# Patient Record
Sex: Female | Born: 1980 | Hispanic: No | Marital: Married | Smoking: Never smoker
Health system: Southern US, Community
[De-identification: ages and names within clinical notes are randomized; demographics above are authoritative.]

## PROBLEM LIST (undated history)

## (undated) ENCOUNTER — Inpatient Hospital Stay (HOSPITAL_COMMUNITY): Payer: Self-pay

## (undated) DIAGNOSIS — E785 Hyperlipidemia, unspecified: Secondary | ICD-10-CM

## (undated) DIAGNOSIS — J309 Allergic rhinitis, unspecified: Secondary | ICD-10-CM

## (undated) DIAGNOSIS — Z789 Other specified health status: Secondary | ICD-10-CM

## (undated) DIAGNOSIS — E559 Vitamin D deficiency, unspecified: Secondary | ICD-10-CM

## (undated) DIAGNOSIS — R519 Headache, unspecified: Secondary | ICD-10-CM

## (undated) DIAGNOSIS — R51 Headache: Secondary | ICD-10-CM

## (undated) HISTORY — PX: NO PAST SURGERIES: SHX2092

## (undated) HISTORY — DX: Headache: R51

## (undated) HISTORY — DX: Vitamin D deficiency, unspecified: E55.9

## (undated) HISTORY — DX: Allergic rhinitis, unspecified: J30.9

## (undated) HISTORY — DX: Headache, unspecified: R51.9

## (undated) HISTORY — DX: Hyperlipidemia, unspecified: E78.5

---

## 2005-10-05 ENCOUNTER — Emergency Department (HOSPITAL_COMMUNITY): Admission: EM | Admit: 2005-10-05 | Discharge: 2005-10-05 | Payer: Self-pay | Admitting: Emergency Medicine

## 2006-04-05 ENCOUNTER — Inpatient Hospital Stay (HOSPITAL_COMMUNITY): Admission: AD | Admit: 2006-04-05 | Discharge: 2006-04-05 | Payer: Self-pay | Admitting: Obstetrics

## 2006-04-14 ENCOUNTER — Inpatient Hospital Stay (HOSPITAL_COMMUNITY): Admission: AD | Admit: 2006-04-14 | Discharge: 2006-04-18 | Payer: Self-pay | Admitting: Obstetrics

## 2007-07-03 ENCOUNTER — Inpatient Hospital Stay (HOSPITAL_COMMUNITY): Admission: AD | Admit: 2007-07-03 | Discharge: 2007-07-06 | Payer: Self-pay | Admitting: Obstetrics & Gynecology

## 2010-12-31 ENCOUNTER — Emergency Department (HOSPITAL_COMMUNITY): Payer: Self-pay

## 2010-12-31 ENCOUNTER — Emergency Department (HOSPITAL_COMMUNITY)
Admission: EM | Admit: 2010-12-31 | Discharge: 2010-12-31 | Discharge status: Against Medical Advice | Payer: Self-pay | Attending: Emergency Medicine | Admitting: Emergency Medicine

## 2010-12-31 DIAGNOSIS — R112 Nausea with vomiting, unspecified: Secondary | ICD-10-CM | POA: Insufficient documentation

## 2010-12-31 DIAGNOSIS — R109 Unspecified abdominal pain: Secondary | ICD-10-CM | POA: Insufficient documentation

## 2010-12-31 LAB — COMPREHENSIVE METABOLIC PANEL
ALT: 31 U/L (ref 0–35)
AST: 24 U/L (ref 0–37)
Albumin: 4.5 g/dL (ref 3.5–5.2)
CO2: 25 mEq/L (ref 19–32)
Calcium: 9.9 mg/dL (ref 8.4–10.5)
Creatinine, Ser: 0.79 mg/dL (ref 0.4–1.2)
GFR calc Af Amer: >60 mL/min (ref >60)
Sodium: 137 mEq/L (ref 135–145)
Total Protein: 8.5 g/dL — ABNORMAL HIGH (ref 6.0–8.3)

## 2010-12-31 LAB — URINALYSIS, ROUTINE W REFLEX MICROSCOPIC
Bilirubin Urine: NEGATIVE
Glucose, UA: NEGATIVE mg/dL
Specific Gravity, Urine: 1.028 (ref 1.005–1.030)
Urobilinogen, UA: 0.2 mg/dL (ref 0.0–1.0)
pH: 5.5 (ref 5.0–8.0)

## 2010-12-31 LAB — DIFFERENTIAL
Basophils Absolute: 0.1 10*3/uL (ref 0.0–0.1)
Basophils Relative: 0 % (ref 0–1)
Eosinophils Relative: 2 % (ref 0–5)
Lymphocytes Relative: 33 % (ref 12–46)

## 2010-12-31 LAB — CBC
HCT: 42.4 % (ref 36.0–46.0)
MCHC: 34 g/dL (ref 30.0–36.0)
Platelets: 279 10*3/uL (ref 150–400)
RDW: 12.4 % (ref 11.5–15.5)
WBC: 13.8 10*3/uL — ABNORMAL HIGH (ref 4.0–10.5)

## 2010-12-31 LAB — PREGNANCY, URINE: Preg Test, Ur: NEGATIVE

## 2010-12-31 LAB — URINE MICROSCOPIC-ADD ON

## 2011-01-13 NOTE — H&P (Signed)
NAMESHEREA, LIPTAK             ACCOUNT NO.:  [id number]   MEDICAL RECORD NO.:  [id number]          PATIENT TYPE:  INP   LOCATION:  9164                          FACILITY:  [facility]   PHYSICIAN:  Roseanna Rainbow, M.D.DATE OF BIRTH:  08/25/1981   DATE OF ADMISSION:  07/03/2007  DATE OF DISCHARGE:                              HISTORY & PHYSICAL   CHIEF COMPLAINT:  The patient is a 30 year old gravida 2, para 1 with an  estimated date of confinement of July 11, 2007, with an intrauterine  pregnancy at 39+ weeks for elective induction of labor.   HISTORY OF PRESENT ILLNESS:  Please see the above.   ALLERGIES:  No known drug allergies.   MEDICATION:  Prenatal vitamins.   OBSTETRICAL RISK FACTORS:  Short inter pregnancy interval and positive  PPD, negative chest x-ray.   PRENATAL LABS:  GC probe negative, chlamydia probe negative.  Sickle  cell negative, RPR nonreactive.  Blood type is A+, antibody screen  negative, platelets 323,000.  HIV nonreactive.  Hemoglobin 12.4,  hematocrit 38.5.  Hepatitis B surface antigen negative.  Pap smear  negative.  GBS negative on June 23, 2007.   PAST GYNECOLOGICAL HISTORY:  Noncontributory.   PAST MEDICAL HISTORY:  No significant history of medical diseases.   PAST SURGICAL HISTORY:  No previous surgery.   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  Does not give any significant history of alcohol usage.  Has no significant smoking history.  Denies illicit drug use.   FAMILY HISTORY:  No major illnesses known.   PAST OBSTETRICAL HISTORY:  In August 2007, she was delivered of a live-  born female 7 pounds 11 ounces, vaginal delivery.  No complications.   PHYSICAL EXAMINATION:  VITAL SIGNS:  Stable, afebrile.  Fetal heart  tracing reassuring.  Tocodynamometer regular uterine contractions.  STERILE VAGINAL EXAM:  Per the RN, loose was 3, 70%.   ASSESSMENT:  Primipara at term and favorable  Bishop score, fetal heart  tracing consistent with fetal well-being.   PLAN:  Admission, induction of labor.  Low-dose Pitocin per protocol.      Roseanna Rainbow, M.D.  Electronically Signed     LAJ/MEDQ  D:  07/04/2007  T:  07/04/2007  Job:  [id number]

## 2011-01-16 NOTE — H&P (Signed)
Kirsten Kramer, Kirsten Kramer             ACCOUNT NO.:  [id number]   MEDICAL RECORD NO.:  [id number]          PATIENT TYPE:  INP   LOCATION:  9164                          FACILITY:  [facility]   PHYSICIAN:  Roseanna Rainbow, M.D.DATE OF BIRTH:  Jul 18, 1981   DATE OF ADMISSION:  04/14/2006  DATE OF DISCHARGE:                                HISTORY & PHYSICAL   CHIEF COMPLAINT:  The patient is a 30 year old para 0 with an estimated date  of confinement of August 10 with an intrauterine pregnancy at 40-5/7 weeks  for an induction of labor.   HISTORY OF PRESENT ILLNESS:  The patient was seen earlier in the office  today and her blood pressures were somewhat labile, 140s/80s.  She denied  any neurological symptoms.   ALLERGIES:  NO KNOWN DRUG ALLERGIES.   MEDICATIONS:  None.   RISK FACTORS:  Positive PPD.  GBS positive.   LABORATORY:  Blood type A positive.  Antibody screen negative.  RPR  nonreactive.  Rubella immune.  Hepatitis B surface antigen negative.  GBS  positive.  Hemoglobin 10.4, hematocrit 32.5.  Chlamydia negative.  GC  negative.  Urine culture and sensitivity, no growth.  Platelets 305,000.  One-hour GCT 135.  HIV declined.  Pap smear within normal limits.   PAST GYN HISTORY:  Noncontributory.   PAST MEDICAL HISTORY:  No significant history of medical diseases.   PAST SURGICAL HISTORY:  No previous surgery.   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  She  is single, does not give any significant history of alcohol usage, has no  significant smoking history.  Denies illicit drug use.   FAMILY HISTORY:  No major illnesses known.   PHYSICAL EXAMINATION:  VITAL SIGNS:  Stable.  Afebrile.  Fetal heart tracing  reassuring.  TOCODYNAMOMETER:  Rare uterine contractions.  SVE:  The cervix is 1, 50%.   ASSESSMENT:  Intrauterine pregnancy at 40-5/7 weeks, unfavorable Bishop  score.  Fetal heart tracing consistent with fetal well being.  Group B  strep  positive.   PLAN:  Admission.  Induction of labor.  Will begin with cervical ripening to  be followed by Pitocin and AROM.  Penicillin GBS prophylaxis in early labor.      Roseanna Rainbow, M.D.  Electronically Signed     LAJ/MEDQ  D:  04/15/2006  T:  04/15/2006  Job:  [id number]

## 2011-06-09 LAB — CBC
HCT: 32.3 — ABNORMAL LOW
HCT: 34.7 — ABNORMAL LOW
Hemoglobin: 10.9 — ABNORMAL LOW
Hemoglobin: 11.6 — ABNORMAL LOW
MCHC: 33.6
MCV: 78.2
Platelets: 187
RBC: 4.14
RDW: 14.9 — ABNORMAL HIGH
RDW: 15.5 — ABNORMAL HIGH
WBC: 11.1 — ABNORMAL HIGH
WBC: 13.5 — ABNORMAL HIGH

## 2011-06-09 LAB — SYPHILIS: RPR W/REFLEX TO RPR TITER AND TREPONEMAL ANTIBODIES, TRADITIONAL SCREENING AND DIAGNOSIS ALGORITHM: RPR Ser Ql: NONREACTIVE

## 2011-10-06 ENCOUNTER — Encounter (HOSPITAL_COMMUNITY): Payer: Self-pay

## 2011-10-06 ENCOUNTER — Inpatient Hospital Stay (HOSPITAL_COMMUNITY)
Admission: AD | Admit: 2011-10-06 | Discharge: 2011-10-06 | Disposition: A | Discharge status: Home / Self Care | Payer: Medicaid Other | Source: Ambulatory Visit | Attending: Obstetrics & Gynecology | Admitting: Obstetrics & Gynecology

## 2011-10-06 DIAGNOSIS — R112 Nausea with vomiting, unspecified: Secondary | ICD-10-CM

## 2011-10-06 DIAGNOSIS — O210 Mild hyperemesis gravidarum: Secondary | ICD-10-CM | POA: Insufficient documentation

## 2011-10-06 HISTORY — DX: Other specified health status: Z78.9

## 2011-10-06 LAB — URINALYSIS, ROUTINE W REFLEX MICROSCOPIC
Glucose, UA: NEGATIVE mg/dL
Ketones, ur: 15 mg/dL — AB
pH: 5 (ref 5.0–8.0)

## 2011-10-06 LAB — COMPREHENSIVE METABOLIC PANEL
ALT: 27 U/L (ref 0–35)
Alkaline Phosphatase: 44 U/L (ref 39–117)
BUN: 7 mg/dL (ref 6–23)
CO2: 24 mEq/L (ref 19–32)
Chloride: 102 mEq/L (ref 96–112)
GFR calc Af Amer: >90 mL/min (ref >90)
GFR calc non Af Amer: >90 mL/min (ref >90)
Glucose, Bld: 93 mg/dL (ref 70–99)
Potassium: 3.9 mEq/L (ref 3.5–5.1)
Sodium: 134 mEq/L — ABNORMAL LOW (ref 135–145)
Total Bilirubin: 0.8 mg/dL (ref 0.3–1.2)

## 2011-10-06 LAB — CBC
HCT: 37.9 % (ref 36.0–46.0)
Hemoglobin: 12.8 g/dL (ref 12.0–15.0)
RBC: 4.8 MIL/uL (ref 3.87–5.11)

## 2011-10-06 LAB — URINE MICROSCOPIC-ADD ON

## 2011-10-06 LAB — POCT PREGNANCY, URINE: Preg Test, Ur: POSITIVE — AB

## 2011-10-06 MED ORDER — PROMETHAZINE HCL 25 MG/ML IJ SOLN
25.0000 mg | Freq: Once | INTRAMUSCULAR | Status: AC
  Administered 2011-10-06: 25 mg via INTRAVENOUS
  Filled 2011-10-06: qty 1

## 2011-10-06 MED ORDER — LACTATED RINGERS IV BOLUS (SEPSIS)
1000.0000 mL | Freq: Once | INTRAVENOUS | Status: AC
  Administered 2011-10-06: 1000 mL via INTRAVENOUS

## 2011-10-06 MED ORDER — PROMETHAZINE HCL 25 MG PO TABS: 25.0000 mg | ORAL_TABLET | Freq: Four times a day (QID) | ORAL | Status: AC | PRN

## 2011-10-06 NOTE — Progress Notes (Signed)
Patient states she has had a pregnancy test at [facility] that states she is 2 months. Has had vomiting and lower abdominal pain for entire time. Denies any bleeding.

## 2011-10-06 NOTE — ED Notes (Signed)
Viable IUP noted on bedside US by Ivonne Andrew CNM

## 2011-10-06 NOTE — ED Provider Notes (Signed)
Attestation of Attending Supervision of Advanced Practitioner: Evaluation and management procedures were performed by the PA/NP/CNM/OB Fellow under my supervision/collaboration. Chart reviewed, and agree with management and plan.  Taheem Fricke, M.D. 10/06/2011 1:58 PM   

## 2011-10-06 NOTE — Progress Notes (Signed)
Vomiting for past 2 months - getting worse.

## 2011-10-06 NOTE — ED Provider Notes (Signed)
History   Pt presents today c/o worsening N&V over the past 2 months. She has also had some mild lower abd cramping. She denies vag dc, bleeding, fever, dysuria, or any other problems at this time.  Chief Complaint  Patient presents with  . Emesis   HPI  OB History    Grav Para Term Preterm Abortions TAB SAB Ect Mult Living   3 2 2  0 0 0 0 0 0 2      Past Medical History  Diagnosis Date  . No pertinent past medical history     Past Surgical History  Procedure Date  . No past surgeries     Family History  Problem Relation Age of Onset  . Anesthesia problems Neg Hx   . Cancer Father     prostate  . Diabetes Father     History  Substance Use Topics  . Smoking status: Never Smoker   . Smokeless tobacco: Never Used  . Alcohol Use: No    Allergies: No Known Allergies  Prescriptions prior to admission  Medication Sig Dispense Refill  . ranitidine (ZANTAC) 75 MG tablet Take 75 mg by mouth 2 (two) times daily. For heartburn.        Review of Systems  Constitutional: Negative for fever.  Eyes: Negative for blurred vision and double vision.  Respiratory: Negative for cough, hemoptysis, sputum production, shortness of breath and wheezing.   Cardiovascular: Negative for chest pain, palpitations, orthopnea and claudication.  Gastrointestinal: Positive for nausea, vomiting and abdominal pain. Negative for diarrhea and constipation.  Genitourinary: Negative for dysuria, urgency, frequency and hematuria.  Neurological: Negative for dizziness and headaches.  Psychiatric/Behavioral: Negative for depression and suicidal ideas.   Physical Exam   Blood pressure 127/73, pulse 82, temperature 98.2 F (36.8 C), temperature source Oral, resp. rate 16, height 5\' 3"  (1.6 m), weight 168 lb 9.6 oz (76.476 kg), last menstrual period 07/07/2011, SpO2 99.00%.  Physical Exam  Nursing note and vitals reviewed. Constitutional: She is oriented to person, place, and time. She appears  well-developed and well-nourished. No distress.  HENT:  Head: Normocephalic and atraumatic.  Eyes: EOM are normal. Pupils are equal, round, and reactive to light.  Neurological: She is alert and oriented to person, place, and time.  Skin: Skin is warm and dry. She is not diaphoretic.  Psychiatric: She has a normal mood and affect. Her behavior is normal. Judgment and thought content normal.    MAU Course  Procedures  Results for orders placed during the hospital encounter of 10/06/11 (from the past 72 hour(s))  URINALYSIS, ROUTINE W REFLEX MICROSCOPIC     Status: Abnormal   Collection Time   10/06/11 10:25 AM      Component Value Range Comment   Color, Urine YELLOW  YELLOW     APPearance CLEAR  CLEAR     Specific Gravity, Urine 1.025  1.005 - 1.030     pH 5.0  5.0 - 8.0     Glucose, UA NEGATIVE  NEGATIVE (mg/dL)    Hgb urine dipstick SMALL (*) NEGATIVE     Bilirubin Urine NEGATIVE  NEGATIVE     Ketones, ur 15 (*) NEGATIVE (mg/dL)    Protein, ur NEGATIVE  NEGATIVE (mg/dL)    Urobilinogen, UA 0.2  0.0 - 1.0 (mg/dL)    Nitrite NEGATIVE  NEGATIVE     Leukocytes, UA TRACE (*) NEGATIVE    URINE MICROSCOPIC-ADD ON     Status: Abnormal   Collection Time  10/06/11 10:25 AM      Component Value Range Comment   Squamous Epithelial / LPF MANY (*) RARE     WBC, UA 3-6  <3 (WBC/hpf)    RBC / HPF 3-6  <3 (RBC/hpf)    Urine-Other MUCOUS PRESENT     POCT PREGNANCY, URINE     Status: Abnormal   Collection Time   10/06/11 10:39 AM      Component Value Range Comment   Preg Test, Ur POSITIVE (*) NEGATIVE    CBC     Status: Abnormal   Collection Time   10/06/11 11:01 AM      Component Value Range Comment   WBC 11.7 (*) 4.0 - 10.5 (K/uL)    RBC 4.80  3.87 - 5.11 (MIL/uL)    Hemoglobin 12.8  12.0 - 15.0 (g/dL)    HCT 40.9  81.1 - 91.4 (%)    MCV 79.0  78.0 - 100.0 (fL)    MCH 26.7  26.0 - 34.0 (pg)    MCHC 33.8  30.0 - 36.0 (g/dL)    RDW 78.2  95.6 - 21.3 (%)    Platelets 235  150 - 400 (K/uL)    COMPREHENSIVE METABOLIC PANEL     Status: Abnormal   Collection Time   10/06/11 11:01 AM      Component Value Range Comment   Sodium 134 (*) 135 - 145 (mEq/L)    Potassium 3.9  3.5 - 5.1 (mEq/L)    Chloride 102  96 - 112 (mEq/L)    CO2 24  19 - 32 (mEq/L)    Glucose, Bld 93  70 - 99 (mg/dL)    BUN 7  6 - 23 (mg/dL)    Creatinine, Ser 0.86  0.50 - 1.10 (mg/dL)    Calcium 9.6  8.4 - 10.5 (mg/dL)    Total Protein 7.0  6.0 - 8.3 (g/dL)    Albumin 3.6  3.5 - 5.2 (g/dL)    AST 17  0 - 37 (U/L)    ALT 27  0 - 35 (U/L)    Alkaline Phosphatase 44  39 - 117 (U/L)    Total Bilirubin 0.8  0.3 - 1.2 (mg/dL)    GFR calc non Af Amer >90  >90 (mL/min)    GFR calc Af Amer >90  >90 (mL/min)     Dorathy Kinsman CNM did bedside US which did demonstrate a single IUP with cardiac activity in the 160s. Assessment and Plan  N&V: discussed with pt at length. Will give Rx for phenergan to use prn. She will f/u with her OB provider. Discussed diet, activity, risks, and precautions.  Clinton Gallant. Rice III, DrHSc, MPAS, PA-C  10/06/2011, 12:32 PM   Henrietta Hoover, PA 10/06/11 1244

## 2011-10-27 ENCOUNTER — Other Ambulatory Visit: Payer: Self-pay

## 2011-10-27 LAB — OB RESULTS CONSOLE RPR: RPR: NONREACTIVE

## 2011-10-27 LAB — OB RESULTS CONSOLE RUBELLA ANTIBODY, IGM: Rubella: IMMUNE

## 2011-10-27 LAB — OB RESULTS CONSOLE HEPATITIS B SURFACE ANTIGEN: Hepatitis B Surface Ag: NEGATIVE

## 2011-10-27 LAB — OB RESULTS CONSOLE HIV ANTIBODY (ROUTINE TESTING): HIV: NONREACTIVE

## 2011-10-28 ENCOUNTER — Encounter (HOSPITAL_COMMUNITY): Payer: Self-pay

## 2011-10-28 ENCOUNTER — Inpatient Hospital Stay (HOSPITAL_COMMUNITY)
Admission: AD | Admit: 2011-10-28 | Discharge: 2011-10-28 | Disposition: A | Discharge status: Home / Self Care | Payer: Medicaid Other | Source: Ambulatory Visit | Attending: Obstetrics & Gynecology | Admitting: Obstetrics & Gynecology

## 2011-10-28 DIAGNOSIS — O26899 Other specified pregnancy related conditions, unspecified trimester: Secondary | ICD-10-CM

## 2011-10-28 DIAGNOSIS — O210 Mild hyperemesis gravidarum: Secondary | ICD-10-CM | POA: Insufficient documentation

## 2011-10-28 DIAGNOSIS — O219 Vomiting of pregnancy, unspecified: Secondary | ICD-10-CM

## 2011-10-28 LAB — CBC
MCV: 79.9 fL (ref 78.0–100.0)
Platelets: 232 10*3/uL (ref 150–400)
RDW: 13.2 % (ref 11.5–15.5)
WBC: 14.3 10*3/uL — ABNORMAL HIGH (ref 4.0–10.5)

## 2011-10-28 LAB — DIFFERENTIAL
Basophils Absolute: 0 10*3/uL (ref 0.0–0.1)
Eosinophils Absolute: 0.3 10*3/uL (ref 0.0–0.7)
Eosinophils Relative: 2 % (ref 0–5)
Lymphocytes Relative: 22 % (ref 12–46)
Neutrophils Relative %: 70 % (ref 43–77)

## 2011-10-28 LAB — COMPREHENSIVE METABOLIC PANEL
ALT: 15 U/L (ref 0–35)
AST: 17 U/L (ref 0–37)
CO2: 25 mEq/L (ref 19–32)
Calcium: 9.7 mg/dL (ref 8.4–10.5)
Sodium: 136 mEq/L (ref 135–145)
Total Protein: 7 g/dL (ref 6.0–8.3)

## 2011-10-28 LAB — URINE MICROSCOPIC-ADD ON

## 2011-10-28 LAB — URINALYSIS, ROUTINE W REFLEX MICROSCOPIC
Glucose, UA: NEGATIVE mg/dL
Nitrite: NEGATIVE
Specific Gravity, Urine: 1.025 (ref 1.005–1.030)
pH: 6 (ref 5.0–8.0)

## 2011-10-28 MED ORDER — FAMOTIDINE 20 MG PO TABS: 20.0000 mg | ORAL_TABLET | Freq: Two times a day (BID) | ORAL | Status: AC

## 2011-10-28 MED ORDER — FAMOTIDINE IN NACL 20-0.9 MG/50ML-% IV SOLN
20.0000 mg | Freq: Once | INTRAVENOUS | Status: AC
  Administered 2011-10-28: 20 mg via INTRAVENOUS
  Filled 2011-10-28: qty 50

## 2011-10-28 MED ORDER — ONDANSETRON 8 MG PO TBDP: 4.0000 mg | ORAL_TABLET | Freq: Three times a day (TID) | ORAL | Status: AC | PRN

## 2011-10-28 MED ORDER — ONDANSETRON HCL 4 MG/2ML IJ SOLN
4.0000 mg | Freq: Once | INTRAMUSCULAR | Status: AC
  Administered 2011-10-28: 4 mg via INTRAVENOUS
  Filled 2011-10-28: qty 2

## 2011-10-28 MED ORDER — LACTATED RINGERS IV SOLN
INTRAVENOUS | Status: DC
  Administered 2011-10-28: 20:00:00 via INTRAVENOUS

## 2011-10-28 NOTE — ED Provider Notes (Signed)
History     CSN: [id number]  Arrival date & time 10/28/11  1827   None     Chief Complaint  Patient presents with  . Hyperemesis Gravidarum   HPI Kirsten Kramer is a 31 y.o. female @ [redacted]w[redacted]d gestation who presents to MAU for nausea and vomiting that started 2 weeks ago. Had diarrhea at first but that has stopped. States every time she eats she vomits. Complains of bad indigestion. The history was provided by the patient.  Past Medical History  Diagnosis Date  . No pertinent past medical history     Past Surgical History  Procedure Date  . No past surgeries     Family History  Problem Relation Age of Onset  . Anesthesia problems Neg Hx   . Cancer Father     prostate  . Diabetes Father     History  Substance Use Topics  . Smoking status: Never Smoker   . Smokeless tobacco: Never Used  . Alcohol Use: No    OB History    Grav Para Term Preterm Abortions TAB SAB Ect Mult Living   3 2 2  0 0 0 0 0 0 2      Review of Systems  Constitutional: Positive for chills. Negative for fever, diaphoresis and fatigue.  HENT: Positive for congestion. Negative for ear pain, sore throat, facial swelling, neck pain, neck stiffness, dental problem and sinus pressure.   Eyes: Negative for photophobia, pain and discharge.  Respiratory: Positive for cough. Negative for chest tightness and wheezing.   Gastrointestinal: Positive for nausea, vomiting and abdominal pain. Negative for diarrhea, constipation and abdominal distention.  Genitourinary: Negative for dysuria, urgency, frequency, flank pain and difficulty urinating.  Musculoskeletal: Positive for back pain. Negative for myalgias and gait problem.  Skin: Negative for color change and rash.  Neurological: Negative for dizziness, speech difficulty, weakness, light-headedness, numbness and headaches.  Psychiatric/Behavioral: Negative for confusion and agitation.    Allergies  Review of patient's allergies indicates no known  allergies.  Home Medications  No current outpatient prescriptions on file.  BP 117/80  Pulse 91  Temp(Src) 100.3 F (37.9 C) (Oral)  Resp 18  Ht 5' 2.5" (1.588 m)  Wt 165 lb 12.8 oz (75.206 kg)  BMI 29.84 kg/m2  SpO2 100%  LMP 07/07/2011  Physical Exam  Nursing note and vitals reviewed. Constitutional: She is oriented to person, place, and time. She appears well-developed and well-nourished. No distress.  HENT:  Head: Normocephalic.       Temporal headache.  Eyes: EOM are normal.  Neck: Neck supple.  Cardiovascular: Normal rate.   Pulmonary/Chest: Effort normal.       Occasional wheezing RUL  Abdominal: Soft. There is no tenderness. There is no CVA tenderness.       +FHT  Musculoskeletal: Normal range of motion.  Neurological: She is alert and oriented to person, place, and time. No cranial nerve deficit.  Skin: Skin is warm and dry.  Psychiatric: She has a normal mood and affect. Her behavior is normal. Judgment and thought content normal.   Results for orders placed during the hospital encounter of 10/28/11 (from the past 24 hour(s))  URINALYSIS, ROUTINE W REFLEX MICROSCOPIC     Status: Abnormal   Collection Time   10/28/11  6:40 PM      Component Value Range   Color, Urine AMBER (*) YELLOW    APPearance CLOUDY (*) CLEAR    Specific Gravity, Urine 1.025  1.005 - 1.030  pH 6.0  5.0 - 8.0    Glucose, UA NEGATIVE  NEGATIVE (mg/dL)   Hgb urine dipstick TRACE (*) NEGATIVE    Bilirubin Urine SMALL (*) NEGATIVE    Ketones, ur 15 (*) NEGATIVE (mg/dL)   Protein, ur NEGATIVE  NEGATIVE (mg/dL)   Urobilinogen, UA 1.0  0.0 - 1.0 (mg/dL)   Nitrite NEGATIVE  NEGATIVE    Leukocytes, UA NEGATIVE  NEGATIVE   URINE MICROSCOPIC-ADD ON     Status: Abnormal   Collection Time   10/28/11  6:40 PM      Component Value Range   Squamous Epithelial / LPF MANY (*) RARE    WBC, UA 0-2  <3 (WBC/hpf)   RBC / HPF 0-2  <3 (RBC/hpf)   Bacteria, UA FEW (*) RARE    Urine-Other MUCOUS PRESENT     CBC     Status: Abnormal   Collection Time   10/28/11  7:30 PM      Component Value Range   WBC 14.3 (*) 4.0 - 10.5 (K/uL)   RBC 4.73  3.87 - 5.11 (MIL/uL)   Hemoglobin 12.8  12.0 - 15.0 (g/dL)   HCT 16.1  09.6 - 04.5 (%)   MCV 79.9  78.0 - 100.0 (fL)   MCH 27.1  26.0 - 34.0 (pg)   MCHC 33.9  30.0 - 36.0 (g/dL)   RDW 40.9  81.1 - 91.4 (%)   Platelets 232  150 - 400 (K/uL)  DIFFERENTIAL     Status: Abnormal   Collection Time   10/28/11  7:30 PM      Component Value Range   Neutrophils Relative 70  43 - 77 (%)   Neutro Abs 10.0 (*) 1.7 - 7.7 (K/uL)   Lymphocytes Relative 22  12 - 46 (%)   Lymphs Abs 3.1  0.7 - 4.0 (K/uL)   Monocytes Relative 6  3 - 12 (%)   Monocytes Absolute 0.8  0.1 - 1.0 (K/uL)   Eosinophils Relative 2  0 - 5 (%)   Eosinophils Absolute 0.3  0.0 - 0.7 (K/uL)   Basophils Relative 0  0 - 1 (%)   Basophils Absolute 0.0  0.0 - 0.1 (K/uL)   Assessment:  Nausea and vomiting in pregnancy  Plan:  Care turned over to Georges Mouse, CNM ED Course  Procedures   MDM     Kerrie Buffalo, NP 10/28/11 2003  Pt feeling better with IV hydration, Pepcid and Zofran IV  A/P: 31 y.o. G3P2002 at [redacted]w[redacted]d N/V and heartburn of pregnancy - rx Pepcid and Zofran F/U as scheduled

## 2011-10-28 NOTE — Progress Notes (Signed)
Pt states n/v began 3 weeks ago, noted diarrhea 2 weeks ago, however none present. States will vomit every time she tries to eat something. Not taking anything presently for hyperemesis. Went to MD office, MD was unavailable to see her, so no rx's obtained.

## 2011-12-18 ENCOUNTER — Other Ambulatory Visit: Payer: Self-pay | Admitting: Obstetrics & Gynecology

## 2011-12-18 DIAGNOSIS — O280 Abnormal hematological finding on antenatal screening of mother: Secondary | ICD-10-CM

## 2011-12-23 ENCOUNTER — Ambulatory Visit (HOSPITAL_COMMUNITY): Payer: Medicaid Other

## 2012-01-01 ENCOUNTER — Ambulatory Visit (HOSPITAL_COMMUNITY)
Admission: RE | Admit: 2012-01-01 | Discharge: 2012-01-01 | Disposition: A | Discharge status: Home / Self Care | Payer: Medicaid Other | Source: Ambulatory Visit | Attending: Obstetrics & Gynecology | Admitting: Obstetrics & Gynecology

## 2012-01-01 DIAGNOSIS — O358XX0 Maternal care for other (suspected) fetal abnormality and damage, not applicable or unspecified: Secondary | ICD-10-CM | POA: Insufficient documentation

## 2012-01-01 DIAGNOSIS — O280 Abnormal hematological finding on antenatal screening of mother: Secondary | ICD-10-CM

## 2012-01-01 DIAGNOSIS — O289 Unspecified abnormal findings on antenatal screening of mother: Secondary | ICD-10-CM | POA: Insufficient documentation

## 2012-01-01 DIAGNOSIS — Z363 Encounter for antenatal screening for malformations: Secondary | ICD-10-CM | POA: Insufficient documentation

## 2012-01-01 DIAGNOSIS — Z1389 Encounter for screening for other disorder: Secondary | ICD-10-CM | POA: Insufficient documentation

## 2012-01-01 NOTE — Progress Notes (Signed)
Genetic Counseling  High-Risk Gestation Note  Appointment Date:  01/01/2012 Referred By: Antionette Char, MD Date of Birth:  05/05/1981    Pregnancy History: G3P2002 Estimated Date of Delivery: 04/29/12 Estimated Gestational Age: [redacted]w[redacted]d Attending: Alpha Gula, MD   [name] was seen for genetic counseling because of an increased risk for fetal Down syndrome based on Quad screening performed through [facility]. The patient was accompanied by her friend.   They were counseled regarding the Quad screen result and the associated >1 in 6 risk for fetal Down syndrome.  We reviewed chromosomes, nondisjunction, and the common features and variable prognosis of Down syndrome.  In addition, we reviewed the screen adjusted reduction in risks for trisomy 18 and ONTDs.  We also discussed other explanations for a screen positive result including: a gestational dating error, differences in maternal metabolism, and normal variation. Upon further investigation, previous ultrasound changed the patient's estimated date of delivery to 04/29/12. Thus, at the time her Quad screen was drawn that pregnancy was [redacted]w[redacted]d gestation, which is too early for interpretation of Quad screen results, meaning there is not an increased risk for fetal Down syndrome from Quad screen at this time. Given the advanced gestational age currently, the option to redraw Quad screen is not available in the pregnancy.   We reviewed other available screening and diagnostic options including detailed ultrasound and amniocentesis.  We discussed the risks, limitations, and benefits of each.  We discussed another type of screening test, noninvasive prenatal testing (NIPT), which utilizes cell free fetal DNA found in the maternal circulation. This test is not diagnostic for chromosome conditions, but can provide information regarding the presence or absence of extra fetal DNA for chromosomes 13, 18 and 21. Thus, it would not identify or  rule out all fetal aneuploidy. The reported detection rate is greater than 99% for Trisomy 21, greater than 97% for Trisomy 18, and is approximately 80% (8 out of 10) for Trisomy 13. The false positive rate is reported to be less than 1% for any of these conditions. After thoughtful consideration of these options, [name] elected to have ultrasound, but declined amniocentesis and cell free fetal DNA testing.  She understands that ultrasound cannot rule out all birth defects or genetic syndromes.  A complete ultrasound was performed today.  The ultrasound report will be sent under separate cover.   Both family histories were reviewed and found to be noncontributory for birth defects, mental retardation, recurrent pregnancy loss, or known genetic conditions. Without further information regarding the provided family history, an accurate genetic risk cannot be calculated. Further genetic counseling is warranted if more information is obtained.  [name] denied exposure to environmental toxins or chemical agents. She denied the use of alcohol, tobacco or street drugs. She denied significant viral illnesses during the course of her pregnancy. Her medical and surgical histories were noncontributory.   I counseled [name] for approximately 25 minutes regarding the above risks and available options.     Quinn Plowman, MS,  Certified Genetic Counselor 01/01/2012

## 2012-01-01 NOTE — Progress Notes (Signed)
Patient seen for detailed anatomy ultrasound.  See full report in [facility].  Patient previously quoted a 1:6 Down syndrome risk by [employer] screen. Clinic ultrasound from 12/09/11 showed a 2 week discrepancy from LMP - dates revised based on this scan. Lab unable to recalculate Down syndrome risk as bloodwork was drawn too early. The patient declined further genetic screening/ testing (free fetal DNA / Amniocentesis)  Single IUP at 23 0/7 weeks Biometry consistent with earlier ultrasound. Normal amniotic fluid volume. Normal fetal anatomic survey No markers associated with Down syndrome noted.  Recommend follow up ultrasound for growth in 4 weeks.  Alpha Gula, MD

## 2012-01-29 ENCOUNTER — Ambulatory Visit (HOSPITAL_COMMUNITY)
Admission: RE | Admit: 2012-01-29 | Discharge: 2012-01-29 | Disposition: A | Discharge status: Home / Self Care | Payer: Medicaid Other | Source: Ambulatory Visit | Attending: Obstetrics & Gynecology | Admitting: Obstetrics & Gynecology

## 2012-01-29 VITALS — BP 103/63 | HR 90 | Wt 184.0 lb

## 2012-01-29 DIAGNOSIS — O289 Unspecified abnormal findings on antenatal screening of mother: Secondary | ICD-10-CM | POA: Insufficient documentation

## 2012-01-29 DIAGNOSIS — O280 Abnormal hematological finding on antenatal screening of mother: Secondary | ICD-10-CM

## 2012-01-29 DIAGNOSIS — Z3689 Encounter for other specified antenatal screening: Secondary | ICD-10-CM | POA: Insufficient documentation

## 2012-01-29 DIAGNOSIS — O351XX0 Maternal care for (suspected) chromosomal abnormality in fetus, not applicable or unspecified: Secondary | ICD-10-CM | POA: Insufficient documentation

## 2012-01-29 DIAGNOSIS — O3510X0 Maternal care for (suspected) chromosomal abnormality in fetus, unspecified, not applicable or unspecified: Secondary | ICD-10-CM | POA: Insufficient documentation

## 2012-01-29 NOTE — Progress Notes (Signed)
Patient seen today  for follow up ultrasound.  See full report in [facility].  Alpha Gula, MD  Single IUP at 27 0/7 weeks Interval fetal growth is appropriate Normal amniotic fluid volume. Normal fetal anatomic survey No markers associated with Down syndrome noted.  Recommend follow up ultrasound as clinically indicated

## 2012-03-31 LAB — OB RESULTS CONSOLE GBS: GBS: NEGATIVE

## 2012-04-22 ENCOUNTER — Telehealth (HOSPITAL_COMMUNITY): Payer: Self-pay | Admitting: *Deleted

## 2012-04-22 ENCOUNTER — Encounter (HOSPITAL_COMMUNITY): Payer: Self-pay | Admitting: *Deleted

## 2012-04-22 NOTE — Telephone Encounter (Signed)
Preadmission screen  

## 2012-04-27 ENCOUNTER — Observation Stay (HOSPITAL_COMMUNITY): Payer: Medicaid Other | Admitting: Anesthesiology

## 2012-04-27 ENCOUNTER — Inpatient Hospital Stay (HOSPITAL_COMMUNITY)
Admission: RE | Admit: 2012-04-27 | Discharge: 2012-04-30 | DRG: 766 | Disposition: A | Discharge status: Home / Self Care | Payer: Medicaid Other | Source: Ambulatory Visit | Attending: Obstetrics & Gynecology | Admitting: Obstetrics & Gynecology

## 2012-04-27 ENCOUNTER — Encounter (HOSPITAL_COMMUNITY)
Admission: RE | Disposition: A | Discharge status: Home / Self Care | Payer: Self-pay | Source: Ambulatory Visit | Attending: Obstetrics & Gynecology

## 2012-04-27 ENCOUNTER — Encounter (HOSPITAL_COMMUNITY): Payer: Self-pay | Admitting: Anesthesiology

## 2012-04-27 ENCOUNTER — Encounter (HOSPITAL_COMMUNITY): Payer: Self-pay

## 2012-04-27 DIAGNOSIS — D62 Acute posthemorrhagic anemia: Secondary | ICD-10-CM

## 2012-04-27 DIAGNOSIS — O321XX0 Maternal care for breech presentation, not applicable or unspecified: Principal | ICD-10-CM | POA: Diagnosis present

## 2012-04-27 LAB — CBC
HCT: 37.1 % (ref 36.0–46.0)
MCH: 25.7 pg — ABNORMAL LOW (ref 26.0–34.0)
MCV: 79.4 fL (ref 78.0–100.0)
RBC: 4.67 MIL/uL (ref 3.87–5.11)
WBC: 9.1 10*3/uL (ref 4.0–10.5)

## 2012-04-27 SURGERY — Surgical Case
Anesthesia: Spinal | Site: Abdomen | Wound class: Clean Contaminated

## 2012-04-27 SURGERY — Surgical Case
Anesthesia: Regional

## 2012-04-27 MED ORDER — NALOXONE HCL 0.4 MG/ML IJ SOLN: 0.4000 mg | INTRAMUSCULAR | Status: DC | PRN

## 2012-04-27 MED ORDER — KETOROLAC TROMETHAMINE 30 MG/ML IJ SOLN: 30.0000 mg | Freq: Four times a day (QID) | INTRAMUSCULAR | Status: DC | PRN

## 2012-04-27 MED ORDER — SCOPOLAMINE 1 MG/3DAYS TD PT72
MEDICATED_PATCH | TRANSDERMAL | Status: AC
  Filled 2012-04-27: qty 1

## 2012-04-27 MED ORDER — DIBUCAINE 1 % RE OINT: 1.0000 "application " | TOPICAL_OINTMENT | RECTAL | Status: DC | PRN

## 2012-04-27 MED ORDER — SCOPOLAMINE 1 MG/3DAYS TD PT72
1.0000 | MEDICATED_PATCH | Freq: Once | TRANSDERMAL | Status: DC
  Filled 2012-04-27: qty 1

## 2012-04-27 MED ORDER — ONDANSETRON HCL 4 MG/2ML IJ SOLN
INTRAMUSCULAR | Status: DC | PRN
  Administered 2012-04-27: 4 mg via INTRAVENOUS

## 2012-04-27 MED ORDER — PRENATAL MULTIVITAMIN CH
1.0000 | ORAL_TABLET | Freq: Every day | ORAL | Status: DC
  Administered 2012-04-28 – 2012-04-30 (×3): 1 via ORAL
  Filled 2012-04-27 (×3): qty 1

## 2012-04-27 MED ORDER — METOCLOPRAMIDE HCL 5 MG/ML IJ SOLN
10.0000 mg | Freq: Three times a day (TID) | INTRAMUSCULAR | Status: DC | PRN
  Administered 2012-04-27: 10 mg via INTRAVENOUS

## 2012-04-27 MED ORDER — KETOROLAC TROMETHAMINE 60 MG/2ML IM SOLN
60.0000 mg | Freq: Once | INTRAMUSCULAR | Status: AC | PRN
  Administered 2012-04-27: 60 mg via INTRAMUSCULAR

## 2012-04-27 MED ORDER — SIMETHICONE 80 MG PO CHEW
80.0000 mg | CHEWABLE_TABLET | Freq: Three times a day (TID) | ORAL | Status: DC
  Administered 2012-04-28 – 2012-04-30 (×9): 80 mg via ORAL

## 2012-04-27 MED ORDER — OXYCODONE-ACETAMINOPHEN 5-325 MG PO TABS
1.0000 | ORAL_TABLET | ORAL | Status: DC | PRN
  Administered 2012-04-28 – 2012-04-30 (×6): 1 via ORAL
  Filled 2012-04-27 (×6): qty 1

## 2012-04-27 MED ORDER — CITRIC ACID-SODIUM CITRATE 334-500 MG/5ML PO SOLN
ORAL | Status: AC
  Administered 2012-04-27: 30 mL
  Filled 2012-04-27: qty 15

## 2012-04-27 MED ORDER — NALBUPHINE HCL 10 MG/ML IJ SOLN
5.0000 mg | INTRAMUSCULAR | Status: DC | PRN
Start: 2012-04-27 — End: 2012-04-27

## 2012-04-27 MED ORDER — DIPHENHYDRAMINE HCL 50 MG/ML IJ SOLN: 25.0000 mg | INTRAMUSCULAR | Status: DC | PRN

## 2012-04-27 MED ORDER — FENTANYL CITRATE 0.05 MG/ML IJ SOLN
INTRAMUSCULAR | Status: DC | PRN
  Administered 2012-04-27: 15 ug via INTRATHECAL

## 2012-04-27 MED ORDER — IBUPROFEN 600 MG PO TABS
600.0000 mg | ORAL_TABLET | Freq: Four times a day (QID) | ORAL | Status: DC
  Administered 2012-04-28 – 2012-04-30 (×9): 600 mg via ORAL
  Filled 2012-04-27 (×6): qty 1

## 2012-04-27 MED ORDER — FENTANYL CITRATE 0.05 MG/ML IJ SOLN
INTRAMUSCULAR | Status: AC
  Filled 2012-04-27: qty 2

## 2012-04-27 MED ORDER — NALBUPHINE HCL 10 MG/ML IJ SOLN
5.0000 mg | INTRAMUSCULAR | Status: DC | PRN
  Filled 2012-04-27: qty 1

## 2012-04-27 MED ORDER — DIPHENHYDRAMINE HCL 50 MG/ML IJ SOLN: 12.5000 mg | INTRAMUSCULAR | Status: DC | PRN

## 2012-04-27 MED ORDER — MEPERIDINE HCL 25 MG/ML IJ SOLN
6.2500 mg | INTRAMUSCULAR | Status: DC | PRN
  Administered 2012-04-27: 6.25 mg via INTRAVENOUS

## 2012-04-27 MED ORDER — ONDANSETRON HCL 4 MG/2ML IJ SOLN: 4.0000 mg | INTRAMUSCULAR | Status: DC | PRN

## 2012-04-27 MED ORDER — ONDANSETRON HCL 4 MG/2ML IJ SOLN: 4.0000 mg | Freq: Three times a day (TID) | INTRAMUSCULAR | Status: DC | PRN

## 2012-04-27 MED ORDER — TERBUTALINE SULFATE 1 MG/ML IJ SOLN
0.2500 mg | Freq: Once | INTRAMUSCULAR | Status: AC
  Administered 2012-04-27: 0.25 mg via SUBCUTANEOUS
  Filled 2012-04-27: qty 1

## 2012-04-27 MED ORDER — LANOLIN HYDROUS EX OINT: 1.0000 "application " | TOPICAL_OINTMENT | CUTANEOUS | Status: DC | PRN

## 2012-04-27 MED ORDER — OXYTOCIN 10 UNIT/ML IJ SOLN
INTRAMUSCULAR | Status: AC
  Filled 2012-04-27: qty 4

## 2012-04-27 MED ORDER — METOCLOPRAMIDE HCL 5 MG/ML IJ SOLN: 10.0000 mg | Freq: Three times a day (TID) | INTRAMUSCULAR | Status: DC | PRN

## 2012-04-27 MED ORDER — ZOLPIDEM TARTRATE 5 MG PO TABS: 5.0000 mg | ORAL_TABLET | Freq: Every evening | ORAL | Status: DC | PRN

## 2012-04-27 MED ORDER — DIPHENHYDRAMINE HCL 25 MG PO CAPS: 25.0000 mg | ORAL_CAPSULE | Freq: Four times a day (QID) | ORAL | Status: DC | PRN

## 2012-04-27 MED ORDER — CEFAZOLIN SODIUM-DEXTROSE 2-3 GM-% IV SOLR
2.0000 g | Freq: Once | INTRAVENOUS | Status: DC
  Filled 2012-04-27: qty 50

## 2012-04-27 MED ORDER — OXYTOCIN 10 UNIT/ML IJ SOLN
40.0000 [IU] | INTRAVENOUS | Status: DC | PRN
  Administered 2012-04-27: 40 [IU] via INTRAVENOUS

## 2012-04-27 MED ORDER — LACTATED RINGERS IV SOLN
INTRAVENOUS | Status: DC
  Administered 2012-04-27: 1000 mL via INTRAVENOUS
  Administered 2012-04-28: 05:00:00 via INTRAVENOUS

## 2012-04-27 MED ORDER — FENTANYL CITRATE 0.05 MG/ML IJ SOLN: 25.0000 ug | INTRAMUSCULAR | Status: DC | PRN

## 2012-04-27 MED ORDER — MEPERIDINE HCL 25 MG/ML IJ SOLN: 6.2500 mg | INTRAMUSCULAR | Status: DC | PRN

## 2012-04-27 MED ORDER — FENTANYL CITRATE 0.05 MG/ML IJ SOLN
25.0000 ug | INTRAMUSCULAR | Status: DC | PRN
  Administered 2012-04-27 (×3): 50 ug via INTRAVENOUS

## 2012-04-27 MED ORDER — CEFAZOLIN SODIUM-DEXTROSE 2-3 GM-% IV SOLR
INTRAVENOUS | Status: AC
  Filled 2012-04-27: qty 50

## 2012-04-27 MED ORDER — IBUPROFEN 600 MG PO TABS
600.0000 mg | ORAL_TABLET | Freq: Four times a day (QID) | ORAL | Status: DC | PRN
  Filled 2012-04-27 (×3): qty 1

## 2012-04-27 MED ORDER — MEPERIDINE HCL 25 MG/ML IJ SOLN
INTRAMUSCULAR | Status: AC
  Filled 2012-04-27: qty 1

## 2012-04-27 MED ORDER — DIPHENHYDRAMINE HCL 25 MG PO CAPS
25.0000 mg | ORAL_CAPSULE | ORAL | Status: DC | PRN
  Filled 2012-04-27: qty 1

## 2012-04-27 MED ORDER — PHENYLEPHRINE 40 MCG/ML (10ML) SYRINGE FOR IV PUSH (FOR BLOOD PRESSURE SUPPORT)
PREFILLED_SYRINGE | INTRAVENOUS | Status: AC
  Filled 2012-04-27: qty 5

## 2012-04-27 MED ORDER — METOCLOPRAMIDE HCL 5 MG/ML IJ SOLN
INTRAMUSCULAR | Status: AC
  Filled 2012-04-27: qty 2

## 2012-04-27 MED ORDER — SIMETHICONE 80 MG PO CHEW: 80.0000 mg | CHEWABLE_TABLET | ORAL | Status: DC | PRN

## 2012-04-27 MED ORDER — DIPHENHYDRAMINE HCL 25 MG PO CAPS: 25.0000 mg | ORAL_CAPSULE | ORAL | Status: DC | PRN

## 2012-04-27 MED ORDER — SODIUM CHLORIDE 0.9 % IJ SOLN: 3.0000 mL | INTRAMUSCULAR | Status: DC | PRN

## 2012-04-27 MED ORDER — LACTATED RINGERS IV SOLN
INTRAVENOUS | Status: DC | PRN
  Administered 2012-04-27 (×2): via INTRAVENOUS

## 2012-04-27 MED ORDER — WITCH HAZEL-GLYCERIN EX PADS: 1.0000 "application " | MEDICATED_PAD | CUTANEOUS | Status: DC | PRN

## 2012-04-27 MED ORDER — KETOROLAC TROMETHAMINE 60 MG/2ML IM SOLN
INTRAMUSCULAR | Status: AC
  Filled 2012-04-27: qty 2

## 2012-04-27 MED ORDER — KETOROLAC TROMETHAMINE 60 MG/2ML IM SOLN
60.0000 mg | Freq: Once | INTRAMUSCULAR | Status: DC | PRN
  Filled 2012-04-27: qty 2

## 2012-04-27 MED ORDER — KETOROLAC TROMETHAMINE 30 MG/ML IJ SOLN: 30.0000 mg | Freq: Four times a day (QID) | INTRAMUSCULAR | Status: AC | PRN

## 2012-04-27 MED ORDER — SODIUM CHLORIDE 0.9 % IV SOLN
1.0000 ug/kg/h | INTRAVENOUS | Status: DC | PRN
  Filled 2012-04-27: qty 2.5

## 2012-04-27 MED ORDER — BUPIVACAINE IN DEXTROSE 0.75-8.25 % IT SOLN
INTRATHECAL | Status: DC | PRN
  Administered 2012-04-27: 1.5 mL via INTRATHECAL

## 2012-04-27 MED ORDER — TETANUS-DIPHTH-ACELL PERTUSSIS 5-2.5-18.5 LF-MCG/0.5 IM SUSP: 0.5000 mL | Freq: Once | INTRAMUSCULAR | Status: DC

## 2012-04-27 MED ORDER — SENNOSIDES-DOCUSATE SODIUM 8.6-50 MG PO TABS
2.0000 | ORAL_TABLET | Freq: Every day | ORAL | Status: DC
  Administered 2012-04-28 – 2012-04-29 (×2): 2 via ORAL

## 2012-04-27 MED ORDER — MENTHOL 3 MG MT LOZG: 1.0000 | LOZENGE | OROMUCOSAL | Status: DC | PRN

## 2012-04-27 MED ORDER — CEFAZOLIN SODIUM-DEXTROSE 2-3 GM-% IV SOLR
INTRAVENOUS | Status: DC | PRN
  Administered 2012-04-27: 2 g via INTRAVENOUS

## 2012-04-27 MED ORDER — MORPHINE SULFATE 0.5 MG/ML IJ SOLN
INTRAMUSCULAR | Status: AC
  Filled 2012-04-27: qty 10

## 2012-04-27 MED ORDER — PHENYLEPHRINE HCL 10 MG/ML IJ SOLN
INTRAMUSCULAR | Status: DC | PRN
  Administered 2012-04-27 (×2): 80 ug via INTRAVENOUS
  Administered 2012-04-27: 40 ug via INTRAVENOUS

## 2012-04-27 MED ORDER — ONDANSETRON HCL 4 MG/2ML IJ SOLN
INTRAMUSCULAR | Status: AC
  Filled 2012-04-27: qty 2

## 2012-04-27 MED ORDER — SCOPOLAMINE 1 MG/3DAYS TD PT72
1.0000 | MEDICATED_PATCH | Freq: Once | TRANSDERMAL | Status: DC
  Administered 2012-04-27: 1.5 mg via TRANSDERMAL

## 2012-04-27 MED ORDER — MORPHINE SULFATE (PF) 0.5 MG/ML IJ SOLN
INTRAMUSCULAR | Status: DC | PRN
  Administered 2012-04-27: .1 mg via INTRATHECAL

## 2012-04-27 MED ORDER — DIPHENHYDRAMINE HCL 50 MG/ML IJ SOLN
12.5000 mg | INTRAMUSCULAR | Status: DC | PRN
  Administered 2012-04-28: 12.5 mg via INTRAVENOUS
  Filled 2012-04-27: qty 1

## 2012-04-27 MED ORDER — ONDANSETRON HCL 4 MG PO TABS: 4.0000 mg | ORAL_TABLET | ORAL | Status: DC | PRN

## 2012-04-27 MED ORDER — SODIUM CHLORIDE 0.9 % IV SOLN: 1.0000 ug/kg/h | INTRAVENOUS | Status: DC | PRN

## 2012-04-27 MED ORDER — KETOROLAC TROMETHAMINE 30 MG/ML IJ SOLN
30.0000 mg | Freq: Four times a day (QID) | INTRAMUSCULAR | Status: AC | PRN
  Administered 2012-04-28: 30 mg via INTRAVENOUS
  Filled 2012-04-27: qty 1

## 2012-04-27 MED ORDER — LACTATED RINGERS IV SOLN
INTRAVENOUS | Status: DC
  Administered 2012-04-27: 14:00:00 via INTRAVENOUS

## 2012-04-27 MED ORDER — OXYTOCIN 40 UNITS IN LACTATED RINGERS INFUSION - SIMPLE MED: 62.5000 mL/h | INTRAVENOUS | Status: AC

## 2012-04-27 MED ORDER — IBUPROFEN 600 MG PO TABS: 600.0000 mg | ORAL_TABLET | Freq: Four times a day (QID) | ORAL | Status: DC | PRN

## 2012-04-27 SURGICAL SUPPLY — 42 items
BENZOIN TINCTURE PRP APPL 2/3 (GAUZE/BANDAGES/DRESSINGS) ×2 IMPLANT
CANISTER WOUND CARE 500ML ATS (WOUND CARE) IMPLANT
CHLORAPREP W/TINT 26ML (MISCELLANEOUS) ×2 IMPLANT
CLOTH BEACON ORANGE TIMEOUT ST (SAFETY) ×2 IMPLANT
CONTAINER PREFILL 10% NBF 15ML (MISCELLANEOUS) IMPLANT
DRESSING TELFA 8X3 (GAUZE/BANDAGES/DRESSINGS) ×2 IMPLANT
DRSG COVADERM 4X10 (GAUZE/BANDAGES/DRESSINGS) IMPLANT
DRSG VAC ATS LRG SENSATRAC (GAUZE/BANDAGES/DRESSINGS) IMPLANT
DRSG VAC ATS MED SENSATRAC (GAUZE/BANDAGES/DRESSINGS) IMPLANT
DRSG VAC ATS SM SENSATRAC (GAUZE/BANDAGES/DRESSINGS) IMPLANT
ELECT REM PT RETURN 9FT ADLT (ELECTROSURGICAL) ×2
ELECTRODE REM PT RTRN 9FT ADLT (ELECTROSURGICAL) ×1 IMPLANT
EXTRACTOR VACUUM M CUP 4 TUBE (SUCTIONS) IMPLANT
GAUZE SPONGE 4X4 12PLY STRL LF (GAUZE/BANDAGES/DRESSINGS) ×4 IMPLANT
GLOVE BIO SURGEON STRL SZ 6.5 (GLOVE) ×4 IMPLANT
GOWN PREVENTION PLUS LG XLONG (DISPOSABLE) ×6 IMPLANT
KIT ABG SYR 3ML LUER SLIP (SYRINGE) IMPLANT
NEEDLE HYPO 25X5/8 SAFETYGLIDE (NEEDLE) ×2 IMPLANT
NS IRRIG 1000ML POUR BTL (IV SOLUTION) ×2 IMPLANT
PACK C SECTION WH (CUSTOM PROCEDURE TRAY) ×2 IMPLANT
PAD ABD 7.5X8 STRL (GAUZE/BANDAGES/DRESSINGS) ×2 IMPLANT
PAD OB MATERNITY 4.3X12.25 (PERSONAL CARE ITEMS) IMPLANT
RTRCTR C-SECT PINK 25CM LRG (MISCELLANEOUS) IMPLANT
SLEEVE SCD COMPRESS KNEE MED (MISCELLANEOUS) IMPLANT
STAPLER VISISTAT 35W (STAPLE) IMPLANT
STRIP CLOSURE SKIN 1/2X4 (GAUZE/BANDAGES/DRESSINGS) ×2 IMPLANT
SUT MNCRL 0 VIOLET CTX 36 (SUTURE) ×4 IMPLANT
SUT MNCRL AB 3-0 PS2 27 (SUTURE) IMPLANT
SUT MONOCRYL 0 CTX 36 (SUTURE) ×4
SUT PDS AB 0 CTX 36 PDP370T (SUTURE) ×2 IMPLANT
SUT PLAIN 0 NONE (SUTURE) IMPLANT
SUT VIC AB 0 CT1 27 (SUTURE) ×2
SUT VIC AB 0 CT1 27XBRD ANBCTR (SUTURE) ×2 IMPLANT
SUT VIC AB 0 CTXB 36 (SUTURE) IMPLANT
SUT VIC AB 2-0 CT1 (SUTURE) ×2 IMPLANT
SUT VIC AB 2-0 CT1 27 (SUTURE) ×1
SUT VIC AB 2-0 CT1 TAPERPNT 27 (SUTURE) ×1 IMPLANT
SUT VIC AB 2-0 SH 27 (SUTURE)
SUT VIC AB 2-0 SH 27XBRD (SUTURE) IMPLANT
TOWEL OR 17X24 6PK STRL BLUE (TOWEL DISPOSABLE) ×4 IMPLANT
TRAY FOLEY CATH 14FR (SET/KITS/TRAYS/PACK) ×2 IMPLANT
WATER STERILE IRR 1000ML POUR (IV SOLUTION) IMPLANT

## 2012-04-27 NOTE — Anesthesia Postprocedure Evaluation (Signed)
  Anesthesia Post-op Note  Patient: Kirsten Kramer  Procedure(s) Performed: Procedure(s) (LRB): CESAREAN SECTION (N/A)  Patient Location: PACU  Anesthesia Type: Spinal  Level of Consciousness: awake, alert  and oriented  Airway and Oxygen Therapy: Patient Spontanous Breathing  Post-op Pain: none  Post-op Assessment: Post-op Vital signs reviewed, Patient's Cardiovascular Status Stable, Respiratory Function Stable, Patent Airway, No signs of Nausea or vomiting, Pain level controlled, No headache and No backache  Post-op Vital Signs: Reviewed and stable  Complications: No apparent anesthesia complications

## 2012-04-27 NOTE — Progress Notes (Signed)
Plan for C/S initiated.  Scheduled for 1445 today.

## 2012-04-27 NOTE — Op Note (Signed)
Cesarean Section Procedure Note   Kirsten Kramer   04/27/2012  Indications: Breech Presentation/s/p failed ECV attempt  Pre-operative Diagnosis:  Breech Presentation/s/p failed ECV attempt   Post-operative Diagnosis: Same   Surgeon: Antionette Char A  Assistants: Coral Ceo A  Anesthesia: spinal  Procedure Details:  The patient was seen in the Holding Room. The risks, benefits, complications, treatment options, and expected outcomes were discussed with the patient. The patient concurred with the proposed plan, giving informed consent. The patient was identified as Kirsten Kramer and the procedure verified as C-Section Delivery. A Time Out was held and the above information confirmed.  After induction of anesthesia, the patient was draped and prepped in the usual sterile manner. A transverse incision was made and carried down through the subcutaneous tissue to the fascia. The fascial incision was made and extended transversely. The fascia was separated from the underlying rectus tissue superiorly and inferiorly. The peritoneum was identified and entered. The peritoneal incision was extended longitudinally. The utero-vesical peritoneal reflection was incised transversely and the bladder flap was bluntly freed from the lower uterine segment. A low transverse uterine incision was made. Delivered from a breech presentation was a  living newborn infant with Apgar scores of 9 at one minute and 9 at five minutes. A cord ph was not sent. The umbilical cord was clamped and cut cord. A sample was obtained for evaluation. The placenta was removed Intact and appeared normal.  The uterine incision was closed with running locked sutures of 1-0 Monocryl. A second imbricating layer of the same suture was placed.  Hemostasis was observed. The paracolic gutters were irrigated. The parieto peritoneum was closed in a running fashion with 2-0 Vicryl.  The fascia was then reapproximated with running sutures  of 0 Vicryl.  The skin was closed with suture.  Instrument, sponge, and needle counts were correct prior the abdominal closure and were correct at the conclusion of the case.    Findings:  Complete breech   Estimated Blood Loss: 600 ml   Total IV Fluids: per Anesthesiology  Urine Output: per Anesthesiology  Specimens: none  Complications: no complications  Disposition: PACU - hemodynamically stable.  Maternal Condition: stable   Baby condition / location:  nursery-stable    Signed: Surgeon(s): Antionette Char, MD Brock Bad, MD

## 2012-04-27 NOTE — OR Nursing (Signed)
PLACENTA TO OR UTILITY

## 2012-04-27 NOTE — Anesthesia Preprocedure Evaluation (Signed)
Anesthesia Evaluation  Patient identified by MRN, date of birth, ID band Patient awake    Reviewed: Allergy & Precautions, H&P , NPO status , Patient's Chart, lab work & pertinent test results, reviewed documented beta blocker date and time   History of Anesthesia Complications Negative for: history of anesthetic complications  Airway Mallampati: III TM Distance: >3 FB Neck ROM: full    Dental  (+) Teeth Intact   Pulmonary neg pulmonary ROS,  breath sounds clear to auscultation        Cardiovascular negative cardio ROS  Rhythm:regular Rate:Normal     Neuro/Psych negative neurological ROS  negative psych ROS   GI/Hepatic Neg liver ROS, GERD-  Medicated,  Endo/Other  negative endocrine ROS  Renal/GU negative Renal ROS  negative genitourinary   Musculoskeletal   Abdominal   Peds  Hematology negative hematology ROS (+)   Anesthesia Other Findings   Reproductive/Obstetrics (+) Pregnancy (breech)                           Anesthesia Physical Anesthesia Plan  ASA: II  Anesthesia Plan: Spinal   Post-op Pain Management:    Induction:   Airway Management Planned:   Additional Equipment:   Intra-op Plan:   Post-operative Plan:   Informed Consent: I have reviewed the patients History and Physical, chart, labs and discussed the procedure including the risks, benefits and alternatives for the proposed anesthesia with the patient or authorized representative who has indicated his/her understanding and acceptance.     Plan Discussed with: Surgeon and CRNA  Anesthesia Plan Comments:         Anesthesia Quick Evaluation

## 2012-04-27 NOTE — Procedures (Signed)
A reactive NST was noted.  The patient is RH positive.  A bedside U/S showed an anterior placenta/normal fluid. Terbutaline had been given.   A time out was performed confirming the patient and procedure.  A forward roll was attempted.  The FHR was within range after this attempt.  This was repeated.  The U/S showed the fetus with a persistent breech presentation.

## 2012-04-27 NOTE — Progress Notes (Signed)
Bedside US per Dr Tamela Oddi.  Breech confirmed.  Anterior placenta. Monitors off for version attempt.

## 2012-04-27 NOTE — H&P (Signed)
Kirsten Kramer is a 31 y.o. female presenting for a scheduled C/D. Maternal Medical History:  Reason for admission: S/P failed ECV attempt.  Fetal activity: Perceived fetal activity is normal.    Prenatal complications: no prenatal complications   OB History    Grav Para Term Preterm Abortions TAB SAB Ect Mult Living   3 2 2  0 0 0 0 0 0 2     Past Medical History  Diagnosis Date  . No pertinent past medical history    Past Surgical History  Procedure Date  . No past surgeries    Family History: family history includes Cancer in her father and Diabetes in her father.  There is no history of Anesthesia problems. Social History:  reports that she has never smoked. She has never used smokeless tobacco. She reports that she does not drink alcohol or use illicit drugs.     Review of Systems  Constitutional: Negative for fever.  Eyes: Negative for blurred vision.  Respiratory: Negative for shortness of breath.   Gastrointestinal: Negative for vomiting.  Skin: Negative for rash.  Neurological: Negative for headaches.      Blood pressure 127/79, pulse 95, temperature 98.1 F (36.7 C), temperature source Oral, resp. rate 18, height 5\' 2"  (1.575 m), weight 83.462 kg (184 lb), last menstrual period 07/07/2011. Maternal Exam:  Abdomen: Patient reports no abdominal tenderness. Fetal presentation: breech  Introitus: not evaluated.   Cervix: not evaluated.   Fetal Exam Fetal Monitor Review: Variability: moderate (6-25 bpm).   Pattern: accelerations present and no decelerations.    Fetal State Assessment: Category I - tracings are normal.     Physical Exam  Constitutional: She appears well-developed.  HENT:  Head: Normocephalic.  Neck: Neck supple. No thyromegaly present.  Cardiovascular: Normal rate and regular rhythm.   Respiratory: Breath sounds normal.  GI: Soft. Bowel sounds are normal.  Skin: No rash noted.    Prenatal labs: ABO, Rh: A/Positive/-- (02/26  0000) Antibody: Negative (02/26 0000) Rubella: Immune (02/26 0000) RPR: Nonreactive (02/26 0000)  HBsAg: Negative (02/26 0000)  HIV: Non-reactive (02/26 0000)  GBS: Negative (08/01 0000)   Assessment/Plan: 31 y.o. multipara with an IUP @ [redacted]w[redacted]d.  Breech presentation s/p a failed version attempt.  Admit Repeat C/D   JACKSON-MOORE,Dung Prien A 04/27/2012, 1:01 PM

## 2012-04-27 NOTE — Anesthesia Procedure Notes (Signed)
Spinal  Patient location during procedure: OR Start time: 04/27/2012 5:58 PM Staffing Performed by: anesthesiologist  Preanesthetic Checklist Completed: patient identified, site marked, surgical consent, pre-op evaluation, timeout performed, IV checked, risks and benefits discussed and monitors and equipment checked Spinal Block Patient position: sitting Prep: site prepped and draped and DuraPrep Patient monitoring: heart rate, cardiac monitor, continuous pulse ox and blood pressure Approach: midline Location: L3-4 Injection technique: single-shot Needle Needle type: Sprotte  Needle gauge: 24 G Needle length: 9 cm Assessment Sensory level: T4 Additional Notes Clear free flow CSF on first attempt.  No paresthesia.  Patient tolerated procedure well.  Jasmine December, MD

## 2012-04-27 NOTE — Transfer of Care (Signed)
Immediate Anesthesia Transfer of Care Note  Patient: Kirsten Kramer  Procedure(s) Performed: Procedure(s) (LRB): CESAREAN SECTION (N/A)  Patient Location: PACU  Anesthesia Type: Spinal  Level of Consciousness: awake, alert  and oriented  Airway & Oxygen Therapy: Patient Spontanous Breathing  Post-op Assessment: Report given to PACU RN and Post -op Vital signs reviewed and stable  Post vital signs: Reviewed and stable  Complications: No apparent anesthesia complications

## 2012-04-28 ENCOUNTER — Encounter (HOSPITAL_COMMUNITY): Payer: Self-pay | Admitting: Obstetrics & Gynecology

## 2012-04-28 LAB — CBC
MCH: 26 pg (ref 26.0–34.0)
MCHC: 32.8 g/dL (ref 30.0–36.0)
RDW: 14.6 % (ref 11.5–15.5)

## 2012-04-28 MED ORDER — SODIUM CHLORIDE 0.9 % IV SOLN
INTRAVENOUS | Status: DC
  Administered 2012-04-28: 07:00:00 via INTRAVENOUS

## 2012-04-28 NOTE — Progress Notes (Signed)
Subjective: Postpartum Day 1: Cesarean Delivery Patient reports tolerating PO.    Objective: Vital signs in last 24 hours: Temp:  [97.8 F (36.6 C)-98.1 F (36.7 C)] 98 F (36.7 C) (08/29 0622) Pulse Rate:  [57-95] 57  (08/29 0622) Resp:  [11-24] 18  (08/29 0622) BP: (90-127)/(49-102) 97/60 mmHg (08/29 0622) SpO2:  [95 %-99 %] 96 % (08/29 0622) Weight:  [184 lb (83.462 kg)] 184 lb (83.462 kg) (08/28 1153)  Physical Exam:  General: alert and no distress Lochia: appropriate Uterine Fundus: firm Incision: healing well DVT Evaluation: No evidence of DVT seen on physical exam.   Basename 04/28/12 0545 04/27/12 1205  HGB 9.5* 12.0  HCT 29.0* 37.1    Assessment/Plan: Status post Cesarean section. Doing well postoperatively.  Continue current care.  HARPER,CHARLES A 04/28/2012, 8:20 AM

## 2012-04-28 NOTE — Progress Notes (Signed)
UR Chart review completed.  

## 2012-04-29 DIAGNOSIS — D62 Acute posthemorrhagic anemia: Secondary | ICD-10-CM | POA: Diagnosis not present

## 2012-04-29 NOTE — Progress Notes (Signed)
Patient ID: Kirsten Kramer, female   DOB: 1980/11/05, 31 y.o.   MRN: [id number] Subjective: POD# 2 s/p Cesarean Delivery.  Indications: breech  RH status/Rubella reviewed. Feeding: breast Patient reports tolerating PO.  Denies HA/SOB/C/P/N/V/dizziness. Breast symptoms: no.  She reports vaginal bleeding as normal, without clots.  She is ambulating, urinating without difficulty.     Objective: Vital signs in last 24 hours: BP 97/63  Pulse 77  Temp 98.2 F (36.8 C) (Oral)  Resp 20  Ht 5\' 2"  (1.575 m)  Wt 83.462 kg (184 lb)  BMI 33.65 kg/m2  SpO2 98%  LMP 07/07/2011  Breastfeeding? Unknown       Physical Exam:  General: alert CV: Regular rate and rhythm Resp: clear Abdomen: soft, nontender, normal bowel sounds Lochia: minimal Uterine Fundus: firm, below umbilicus, nontender Incision: clean, dry and intact Ext: extremities normal, atraumatic, no cyanosis or edema    Basename 04/28/12 0545 04/27/12 1205  HGB 9.5* 12.0  HCT 29.0* 37.1      Assessment/Plan: 31 y.o.  status post Cesarean section. POD# 2.   Doing well, stable.               Ambulate IS Routine post-op care  JACKSON-MOORE,Syriah Delisi A 04/29/2012, 1:00 PM

## 2012-04-30 MED ORDER — MAGNESIUM HYDROXIDE 400 MG/5ML PO SUSP: 30.0000 mL | Freq: Every day | ORAL | Status: AC | PRN

## 2012-04-30 MED ORDER — FUSION PLUS PO CAPS: 1.0000 | ORAL_CAPSULE | Freq: Every day | ORAL | Status: DC

## 2012-04-30 MED ORDER — OXYCODONE-ACETAMINOPHEN 5-325 MG PO TABS: 1.0000 | ORAL_TABLET | ORAL | Status: AC | PRN

## 2012-04-30 MED ORDER — IBUPROFEN 600 MG PO TABS: 600.0000 mg | ORAL_TABLET | Freq: Four times a day (QID) | ORAL | Status: AC

## 2012-04-30 MED ORDER — FUSION PLUS PO CAPS: 1.0000 | ORAL_CAPSULE | Freq: Every day | ORAL | Status: AC

## 2012-04-30 MED ORDER — SENNOSIDES-DOCUSATE SODIUM 8.6-50 MG PO TABS: 2.0000 | ORAL_TABLET | Freq: Every day | ORAL | Status: AC

## 2012-04-30 NOTE — Discharge Summary (Signed)
Obstetric Discharge Summary Reason for Admission: cesarean section Prenatal Procedures: ultrasound Intrapartum Procedures: cesarean: low cervical, transverse Postpartum Procedures: none Complications-Operative and Postpartum: none Hemoglobin  Date Value Range Status  04/28/2012 9.5* 12.0 - 15.0 g/dL Final     DELTA CHECK NOTED     REPEATED TO VERIFY     HCT  Date Value Range Status  04/28/2012 29.0* 36.0 - 46.0 % Final    Physical Exam:  General: alert and no distress Lochia: appropriate Uterine Fundus: firm Incision: healing well DVT Evaluation: No evidence of DVT seen on physical exam.  Discharge Diagnoses: Term Pregnancy-delivered  Discharge Information: Date: 04/30/2012 Activity: pelvic rest Diet: routine Medications: PNV, Ibuprofen, Colace, Iron and Percocet Condition: stable Instructions: refer to practice specific booklet Discharge to: home Follow-up Information    Follow up with Antionette Char A, MD. Schedule an appointment as soon as possible for a visit in 2 weeks.   Contact information:   [address] [place] [address] [phone]          Newborn Data: Live born female  Birth Weight: 8 lb 8.7 oz (3875 g) APGAR: 9, 9  Home with mother.  Jermika Olden A 04/30/2012, 11:02 AM

## 2012-04-30 NOTE — Progress Notes (Signed)
Subjective: Postpartum Day 3: Cesarean Delivery Patient reports tolerating PO and no problems voiding.    Objective: Vital signs in last 24 hours: Temp:  [98.1 F (36.7 C)-98.5 F (36.9 C)] 98.1 F (36.7 C) (08/31 0553) Pulse Rate:  [70-73] 70  (08/31 0553) Resp:  [18-19] 18  (08/31 0553) BP: (94-105)/(57-65) 94/57 mmHg (08/31 0553)  Physical Exam:  General: alert and no distress Lochia: appropriate Uterine Fundus: firm Incision: healing well DVT Evaluation: No evidence of DVT seen on physical exam.   Basename 04/28/12 0545 04/27/12 1205  HGB 9.5* 12.0  HCT 29.0* 37.1    Assessment/Plan: Status post Cesarean section. Doing well postoperatively.  Discharge home with standard precautions and return to clinic in 2 weeks.  Suan Pyeatt A 04/30/2012, 9:46 AM

## 2014-07-02 ENCOUNTER — Encounter (HOSPITAL_COMMUNITY): Payer: Self-pay | Admitting: Obstetrics & Gynecology

## 2014-08-28 ENCOUNTER — Encounter: Payer: Self-pay | Admitting: Obstetrics & Gynecology

## 2014-09-28 ENCOUNTER — Encounter (HOSPITAL_COMMUNITY): Payer: Self-pay | Admitting: Emergency Medicine

## 2014-09-28 ENCOUNTER — Emergency Department (HOSPITAL_COMMUNITY)
Admission: EM | Admit: 2014-09-28 | Discharge: 2014-09-28 | Disposition: A | Discharge status: Home / Self Care | Payer: 59 | Attending: Emergency Medicine | Admitting: Emergency Medicine

## 2014-09-28 ENCOUNTER — Emergency Department (HOSPITAL_COMMUNITY): Payer: 59

## 2014-09-28 DIAGNOSIS — Z79899 Other long term (current) drug therapy: Secondary | ICD-10-CM | POA: Insufficient documentation

## 2014-09-28 DIAGNOSIS — Y9389 Activity, other specified: Secondary | ICD-10-CM | POA: Insufficient documentation

## 2014-09-28 DIAGNOSIS — W19XXXA Unspecified fall, initial encounter: Secondary | ICD-10-CM

## 2014-09-28 DIAGNOSIS — W01198A Fall on same level from slipping, tripping and stumbling with subsequent striking against other object, initial encounter: Secondary | ICD-10-CM | POA: Diagnosis not present

## 2014-09-28 DIAGNOSIS — S060X9A Concussion with loss of consciousness of unspecified duration, initial encounter: Secondary | ICD-10-CM | POA: Diagnosis not present

## 2014-09-28 DIAGNOSIS — Y998 Other external cause status: Secondary | ICD-10-CM | POA: Insufficient documentation

## 2014-09-28 DIAGNOSIS — Y9289 Other specified places as the place of occurrence of the external cause: Secondary | ICD-10-CM | POA: Diagnosis not present

## 2014-09-28 DIAGNOSIS — S0990XA Unspecified injury of head, initial encounter: Secondary | ICD-10-CM | POA: Diagnosis present

## 2014-09-28 MED ORDER — ONDANSETRON HCL 4 MG PO TABS: 4.0000 mg | ORAL_TABLET | Freq: Four times a day (QID) | ORAL | Status: AC

## 2014-09-28 MED ORDER — ONDANSETRON 4 MG PO TBDP
4.0000 mg | ORAL_TABLET | Freq: Once | ORAL | Status: AC
  Administered 2014-09-28: 4 mg via ORAL
  Filled 2014-09-28: qty 1

## 2014-09-28 MED ORDER — TRAMADOL HCL 50 MG PO TABS
50.0000 mg | ORAL_TABLET | Freq: Once | ORAL | Status: AC
  Administered 2014-09-28: 50 mg via ORAL
  Filled 2014-09-28: qty 1

## 2014-09-28 NOTE — ED Notes (Signed)
Pt slipped on ice and hit her head on the concrete this morning. Pt had LOC and couldn't remember anything for about 20 minutes per husband. Pt has vomited about 3 times today. Pt c/o headache. Rates pain 10/10. Pt alert, no acute distress. Skin warm, dry. C/o nausea. Pt ambulatory to exam room with steady gait.

## 2014-09-28 NOTE — ED Provider Notes (Signed)
CSN: [id number]     Arrival date & time 09/28/14  1824 History   First MD Initiated Contact with Patient 09/28/14 2100     Chief Complaint  Patient presents with  . Fall     (Consider location/radiation/quality/duration/timing/severity/associated sxs/prior Treatment) HPI   PCP: Roseanna RainbowJACKSON-MOORE,LISA A, MD Blood pressure 124/77, pulse 78, temperature 98.4 F (36.9 C), temperature source Oral, resp. rate 17, SpO2 100 %, unknown if currently breastfeeding.  Kirsten GoutyKhadija Kramer is a 34 y.o.female without any significant PMH presents to the ER with complaints of head injury. This morning at 7am she slipped on ice hitting her head. It was witnessed by "her kids". They do not know how long her LOC was. Her husband reports her being confused for 20 minutes. She has had 3 episodes of vomiting today and is complaining of headache and nausea.  Pt is ambulatory, no change in vision, no ataxia or aphagia. NO longer having memory difficulties.   Past Medical History  Diagnosis Date  . No pertinent past medical history    Past Surgical History  Procedure Laterality Date  . No past surgeries    . Cesarean section  04/27/2012    Procedure: CESAREAN SECTION;  Surgeon: Antionette CharLisa Jackson-Moore, MD;  Location: [facility];  Service: Gynecology;  Laterality: N/A;   Family History  Problem Relation Age of Onset  . Anesthesia problems Neg Hx   . Cancer Father     prostate  . Diabetes Father    History  Substance Use Topics  . Smoking status: Never Smoker   . Smokeless tobacco: Never Used  . Alcohol Use: No   OB History    Gravida Para Term Preterm AB TAB SAB Ectopic Multiple Living   3 3 3  0 0 0 0 0 0 3     Review of Systems  10 Systems reviewed and are negative for acute change except as noted in the HPI.     Allergies  Review of patient's allergies indicates no known allergies.  Home Medications   Prior to Admission medications   Medication Sig Start Date End Date Taking? Authorizing Provider   famotidine (PEPCID) 20 MG tablet Take 1 tablet (20 mg total) by mouth 2 (two) times daily. 10/28/11 10/27/12  Archie PattenNatalie K Frazier, CNM  Iron-FA-B Cmp-C-Biot-Probiotic (FUSION PLUS) CAPS Take 1 capsule by mouth daily before breakfast. 04/30/12   Brock Badharles A Harper, MD  Iron-FA-B Cmp-C-Biot-Probiotic (FUSION PLUS) CAPS Take 1 capsule by mouth daily before breakfast. 04/30/12   Brock Badharles A Harper, MD  Prenatal Vit-Fe Fumarate-FA (PRENATAL MULTIVITAMIN) TABS Take 1 tablet by mouth daily.    Historical Provider, MD   BP 124/77 mmHg  Pulse 78  Temp(Src) 98.4 F (36.9 C) (Oral)  Resp 17  SpO2 100% Physical Exam  Constitutional: She appears well-developed and well-nourished. No distress.  HENT:  Head: Normocephalic and atraumatic. Head is without raccoon's eyes, without abrasion, without contusion, without right periorbital erythema and without left periorbital erythema.  Right Ear: Tympanic membrane and ear canal normal.  Left Ear: Tympanic membrane and ear canal normal.  Nose: Nose normal.  Mouth/Throat: Uvula is midline and oropharynx is clear and moist.  No obvious contusion/hematoma to scalp  Eyes: Pupils are equal, round, and reactive to light.  Neck: Normal range of motion. Neck supple. No spinous process tenderness and no muscular tenderness present.  Cardiovascular: Normal rate and regular rhythm.   Pulmonary/Chest: Effort normal.  Abdominal: Soft.  Neurological: She is alert.  Cranial nerves II-VIII and X-XII evaluated  and show no deficits. Pt alert and oriented x 3 Upper and lower extremity strength is symmetrical and physiologic Normal muscular tone No facial droop Coordination intact, no limb ataxia,  No pronator drift   Skin: Skin is warm and dry.  Nursing note and vitals reviewed.   ED Course  Procedures (including critical care time) Labs Review Labs Reviewed - No data to display  Imaging Review Ct Head Wo Contrast  09/28/2014   CLINICAL DATA:  Slip and fall on ice, hit  head on concrete today. Loss of consciousness, 20 min of amnesia. Three episodes of vomiting today.  EXAM: CT HEAD WITHOUT CONTRAST  TECHNIQUE: Contiguous axial images were obtained from the base of the skull through the vertex without intravenous contrast.  COMPARISON:  None.  FINDINGS: The ventricles and sulci are normal for age. No intraparenchymal hemorrhage, mass effect nor midline shift. Patchy supratentorial white matter hypodensities are within normal range for patient's age and though non-specific suggest sequelae of chronic small vessel ischemic disease. No acute large vascular territory infarcts.  No abnormal extra-axial fluid collections. Basal cisterns are patent. Moderate calcific atherosclerosis of the carotid siphons.  No skull fracture. The included ocular globes and orbital contents are non-suspicious. The mastoid aircells and included paranasal sinuses are well-aerated.  IMPRESSION: No acute intracranial process ; normal noncontrast CT of the head.   Electronically Signed   By: Awilda Metro   On: 09/28/2014 21:27     EKG Interpretation None      MDM   Final diagnoses:  Fall  Concussion, with loss of consciousness of unspecified duration, initial encounter    The patient has a normal neuro exam and head CT. She was given medication in the ED for her headache. Discharged with Zofran for nausea and tylenol for her headache. Given the normal head CT and injury happening 14 + hours ago, I feel the pt is safe to dc at this time. Recommend she call Neurology on Monday if still having symptoms. Her husband is present and is reliable to keep an eye on her and bring her back to the ED sooner if red flag symptoms present.   33 y.o.Kirsten Kramer's evaluation in the Emergency Department is complete. It has been determined that no acute conditions requiring further emergency intervention are present at this time. The patient/guardian have been advised of the diagnosis and plan. We have  discussed signs and symptoms that warrant return to the ED, such as changes or worsening in symptoms.  Vital signs are stable at discharge. Filed Vitals:   09/28/14 1911  BP: 124/77  Pulse: 78  Temp: 98.4 F (36.9 C)  Resp: 17    Patient/guardian has voiced understanding and agreed to follow-up with the PCP or specialist.     Dorthula Matas, PA-C 09/28/14 2203  Dorthula Matas, PA-C 09/28/14 2203  Linwood Dibbles, MD 09/29/14 [phone]

## 2014-09-28 NOTE — Discharge Instructions (Signed)
Concussion  A concussion, or closed-head injury, is a brain injury caused by a direct blow to the head or by a quick and sudden movement (jolt) of the head or neck. Concussions are usually not life-threatening. Even so, the effects of a concussion can be serious. If you have had a concussion before, you are more likely to experience concussion-like symptoms after a direct blow to the head.   CAUSES  · Direct blow to the head, such as from running into another player during a soccer game, being hit in a fight, or hitting your head on a hard surface.  · A jolt of the head or neck that causes the brain to move back and forth inside the skull, such as in a car crash.  SIGNS AND SYMPTOMS  The signs of a concussion can be hard to notice. Early on, they may be missed by you, family members, and health care providers. You may look fine but act or feel differently.  Symptoms are usually temporary, but they may last for days, weeks, or even longer. Some symptoms may appear right away while others may not show up for hours or days. Every head injury is different. Symptoms include:  · Mild to moderate headaches that will not go away.  · A feeling of pressure inside your head.  · Having more trouble than usual:  ¨ Learning or remembering things you have heard.  ¨ Answering questions.  ¨ Paying attention or concentrating.  ¨ Organizing daily tasks.  ¨ Making decisions and solving problems.  · Slowness in thinking, acting or reacting, speaking, or reading.  · Getting lost or being easily confused.  · Feeling tired all the time or lacking energy (fatigued).  · Feeling drowsy.  · Sleep disturbances.  ¨ Sleeping more than usual.  ¨ Sleeping less than usual.  ¨ Trouble falling asleep.  ¨ Trouble sleeping (insomnia).  · Loss of balance or feeling lightheaded or dizzy.  · Nausea or vomiting.  · Numbness or tingling.  · Increased sensitivity to:  ¨ Sounds.  ¨ Lights.  ¨ Distractions.  · Vision problems or eyes that tire  easily.  · Diminished sense of taste or smell.  · Ringing in the ears.  · Mood changes such as feeling sad or anxious.  · Becoming easily irritated or angry for little or no reason.  · Lack of motivation.  · Seeing or hearing things other people do not see or hear (hallucinations).  DIAGNOSIS  Your health care provider can usually diagnose a concussion based on a description of your injury and symptoms. He or she will ask whether you passed out (lost consciousness) and whether you are having trouble remembering events that happened right before and during your injury.  Your evaluation might include:  · A brain scan to look for signs of injury to the brain. Even if the test shows no injury, you may still have a concussion.  · Blood tests to be sure other problems are not present.  TREATMENT  · Concussions are usually treated in an emergency department, in urgent care, or at a clinic. You may need to stay in the hospital overnight for further treatment.  · Tell your health care provider if you are taking any medicines, including prescription medicines, over-the-counter medicines, and natural remedies. Some medicines, such as blood thinners (anticoagulants) and aspirin, may increase the chance of complications. Also tell your health care provider whether you have had alcohol or are taking illegal drugs. This information   may affect treatment.  · Your health care provider will send you home with important instructions to follow.  · How fast you will recover from a concussion depends on many factors. These factors include how severe your concussion is, what part of your brain was injured, your age, and how healthy you were before the concussion.  · Most people with mild injuries recover fully. Recovery can take time. In general, recovery is slower in older persons. Also, persons who have had a concussion in the past or have other medical problems may find that it takes longer to recover from their current injury.  HOME  CARE INSTRUCTIONS  General Instructions  · Carefully follow the directions your health care provider gave you.  · Only take over-the-counter or prescription medicines for pain, discomfort, or fever as directed by your health care provider.  · Take only those medicines that your health care provider has approved.  · Do not drink alcohol until your health care provider says you are well enough to do so. Alcohol and certain other drugs may slow your recovery and can put you at risk of further injury.  · If it is harder than usual to remember things, write them down.  · If you are easily distracted, try to do one thing at a time. For example, do not try to watch TV while fixing dinner.  · Talk with family members or close friends when making important decisions.  · Keep all follow-up appointments. Repeated evaluation of your symptoms is recommended for your recovery.  · Watch your symptoms and tell others to do the same. Complications sometimes occur after a concussion. Older adults with a brain injury may have a higher risk of serious complications, such as a blood clot on the brain.  · Tell your teachers, school nurse, school counselor, coach, athletic trainer, or work manager about your injury, symptoms, and restrictions. Tell them about what you can or cannot do. They should watch for:  ¨ Increased problems with attention or concentration.  ¨ Increased difficulty remembering or learning new information.  ¨ Increased time needed to complete tasks or assignments.  ¨ Increased irritability or decreased ability to cope with stress.  ¨ Increased symptoms.  · Rest. Rest helps the brain to heal. Make sure you:  ¨ Get plenty of sleep at night. Avoid staying up late at night.  ¨ Keep the same bedtime hours on weekends and weekdays.  ¨ Rest during the day. Take daytime naps or rest breaks when you feel tired.  · Limit activities that require a lot of thought or concentration. These include:  ¨ Doing homework or job-related  work.  ¨ Watching TV.  ¨ Working on the computer.  · Avoid any situation where there is potential for another head injury (football, hockey, soccer, basketball, martial arts, downhill snow sports and horseback riding). Your condition will get worse every time you experience a concussion. You should avoid these activities until you are evaluated by the appropriate follow-up health care providers.  Returning To Your Regular Activities  You will need to return to your normal activities slowly, not all at once. You must give your body and brain enough time for recovery.  · Do not return to sports or other athletic activities until your health care provider tells you it is safe to do so.  · Ask your health care provider when you can drive, ride a bicycle, or operate heavy machinery. Your ability to react may be slower after a   brain injury. Never do these activities if you are dizzy.  · Ask your health care provider about when you can return to work or school.  Preventing Another Concussion  It is very important to avoid another brain injury, especially before you have recovered. In rare cases, another injury can lead to permanent brain damage, brain swelling, or death. The risk of this is greatest during the first 7-10 days after a head injury. Avoid injuries by:  · Wearing a seat belt when riding in a car.  · Drinking alcohol only in moderation.  · Wearing a helmet when biking, skiing, skateboarding, skating, or doing similar activities.  · Avoiding activities that could lead to a second concussion, such as contact or recreational sports, until your health care provider says it is okay.  · Taking safety measures in your home.  ¨ Remove clutter and tripping hazards from floors and stairways.  ¨ Use grab bars in bathrooms and handrails by stairs.  ¨ Place non-slip mats on floors and in bathtubs.  ¨ Improve lighting in dim areas.  SEEK MEDICAL CARE IF:  · You have increased problems paying attention or  concentrating.  · You have increased difficulty remembering or learning new information.  · You need more time to complete tasks or assignments than before.  · You have increased irritability or decreased ability to cope with stress.  · You have more symptoms than before.  Seek medical care if you have any of the following symptoms for more than 2 weeks after your injury:  · Lasting (chronic) headaches.  · Dizziness or balance problems.  · Nausea.  · Vision problems.  · Increased sensitivity to noise or light.  · Depression or mood swings.  · Anxiety or irritability.  · Memory problems.  · Difficulty concentrating or paying attention.  · Sleep problems.  · Feeling tired all the time.  SEEK IMMEDIATE MEDICAL CARE IF:  · You have severe or worsening headaches. These may be a sign of a blood clot in the brain.  · You have weakness (even if only in one hand, leg, or part of the face).  · You have numbness.  · You have decreased coordination.  · You vomit repeatedly.  · You have increased sleepiness.  · One pupil is larger than the other.  · You have convulsions.  · You have slurred speech.  · You have increased confusion. This may be a sign of a blood clot in the brain.  · You have increased restlessness, agitation, or irritability.  · You are unable to recognize people or places.  · You have neck pain.  · It is difficult to wake you up.  · You have unusual behavior changes.  · You lose consciousness.  MAKE SURE YOU:  · Understand these instructions.  · Will watch your condition.  · Will get help right away if you are not doing well or get worse.  Document Released: 11/07/2003 Document Revised: 08/22/2013 Document Reviewed: 03/09/2013  Exit[employer]® Patient Information ©2015 [employer]. This information is not intended to replace advice given to you by your health care provider. Make sure you discuss any questions you have with your health care provider.

## 2018-05-30 ENCOUNTER — Encounter: Payer: Self-pay | Admitting: *Deleted

## 2018-05-31 ENCOUNTER — Encounter: Payer: Self-pay | Admitting: Diagnostic Neuroimaging

## 2018-05-31 ENCOUNTER — Ambulatory Visit: Payer: BC Managed Care – PPO | Admitting: Diagnostic Neuroimaging

## 2018-05-31 VITALS — BP 128/86 | HR 78 | Ht 63.0 in | Wt 174.0 lb

## 2018-05-31 DIAGNOSIS — G43009 Migraine without aura, not intractable, without status migrainosus: Secondary | ICD-10-CM | POA: Diagnosis not present

## 2018-05-31 MED ORDER — RIZATRIPTAN BENZOATE 10 MG PO TBDP: 10.0000 mg | ORAL_TABLET | ORAL | 11 refills | Status: AC | PRN

## 2018-05-31 MED ORDER — TOPIRAMATE 50 MG PO TABS: 50.0000 mg | ORAL_TABLET | Freq: Two times a day (BID) | ORAL | 12 refills | Status: AC

## 2018-05-31 NOTE — Progress Notes (Signed)
[facility]  PATIENT: Kirsten Kramer DOB: August 01, 1981  REFERRING CLINICIAN: Osei-Bonsu HISTORY FROM: patient and husband  REASON FOR VISIT: new consult    HISTORICAL  CHIEF COMPLAINT:  Chief Complaint  Patient presents with  . Headache    rm 6, New Pt, brother in law--Mohamed, "headaches for a long time"    HISTORY OF PRESENT ILLNESS:   37 year old female here for evaluation of headaches.  Symptoms started in 2017 she describes left greater than right side, unilateral, sometimes bilateral throbbing aching headaches lasting hours at a time.  Sometimes associated with nausea and vomiting and blurred vision.  Sometimes having sensitive to light.   Patient was prescribed topiramate, however patient took this on an as-needed basis and did not find relief.  Otherwise patient has tried ibuprofen with mild relief.  No specific triggering or aggravating factors.  No family history of headaches or migraines.  No similar headaches in the past.    REVIEW OF SYSTEMS: Full 14 system review of systems performed and negative with exception of: Headache.  ALLERGIES: No Known Allergies  HOME MEDICATIONS: Outpatient Medications Prior to Visit  Medication Sig Dispense Refill  . famotidine (PEPCID) 20 MG tablet Take 1 tablet (20 mg total) by mouth 2 (two) times daily. 60 tablet 6  . Iron-FA-B Cmp-C-Biot-Probiotic (FUSION PLUS) CAPS Take 1 capsule by mouth daily before breakfast. (Patient not taking: Reported on 05/31/2018) 30 capsule 5  . ondansetron (ZOFRAN) 4 MG tablet Take 1 tablet (4 mg total) by mouth every 6 (six) hours. (Patient not taking: Reported on 05/31/2018) 12 tablet 0  . Prenatal Vit-Fe Fumarate-FA (PRENATAL MULTIVITAMIN) TABS Take 1 tablet by mouth daily.    . Iron-FA-B Cmp-C-Biot-Probiotic (FUSION PLUS) CAPS Take 1 capsule by mouth daily before breakfast. 30 capsule 5   No facility-administered medications prior to visit.     PAST MEDICAL  HISTORY: Past Medical History:  Diagnosis Date  . Allergic rhinitis   . Dyslipidemia   . Frequent headaches   . No pertinent past medical history   . Vitamin D deficiency     PAST SURGICAL HISTORY: Past Surgical History:  Procedure Laterality Date  . CESAREAN SECTION  04/27/2012   Procedure: CESAREAN SECTION;  Surgeon: Antionette Char, MD;  Location: [facility];  Service: Gynecology;  Laterality: N/A;  . NO PAST SURGERIES      FAMILY HISTORY: Family History  Problem Relation Age of Onset  . Cancer Father        prostate  . Diabetes Father   . Anesthesia problems Neg Hx     SOCIAL HISTORY: Social History   Socioeconomic History  . Marital status: Married    Spouse name: Not on file  . Number of children: 3  . Years of education: Not on file  . Highest education level: Not on file  Occupational History    Comment: [employer], cook  Social Needs  . Financial resource strain: Not on file  . Food insecurity:    Worry: Not on file    Inability: Not on file  . Transportation needs:    Medical: Not on file    Non-medical: Not on file  Tobacco Use  . Smoking status: Never Smoker  . Smokeless tobacco: Never Used  Substance and Sexual Activity  . Alcohol use: No  . Drug use: No  . Sexual activity: Yes    Birth control/protection: None    Comment: IUD removed in October  Lifestyle  . Physical activity:  Days per week: Not on file    Minutes per session: Not on file  . Stress: Not on file  Relationships  . Social connections:    Talks on phone: Not on file    Gets together: Not on file    Attends religious service: Not on file    Active member of club or organization: Not on file    Attends meetings of clubs or organizations: Not on file    Relationship status: Not on file  . Intimate partner violence:    Fear of current or ex partner: Not on file    Emotionally abused: Not on file    Physically abused: Not on file    Forced sexual activity: Not on  file  Other Topics Concern  . Not on file  Social History Narrative   Lives with children   Caffeine- coffee     PHYSICAL EXAM  GENERAL EXAM/CONSTITUTIONAL: Vitals:  Vitals:   05/31/18 1443  BP: 128/86  Pulse: 78  Weight: 174 lb (78.9 kg)  Height: 5\' 3"  (1.6 m)     Body mass index is 30.82 kg/m. Wt Readings from Last 3 Encounters:  05/31/18 174 lb (78.9 kg)  04/27/12 184 lb (83.5 kg)  01/29/12 184 lb (83.5 kg)     Patient is in no distress; well developed, nourished and groomed; neck is supple  CARDIOVASCULAR:  Examination of carotid arteries is normal; no carotid bruits  Regular rate and rhythm, no murmurs  Examination of peripheral vascular system by observation and palpation is normal  EYES:  Ophthalmoscopic exam of optic discs and posterior segments is normal; no papilledema or hemorrhages  Visual Acuity Screening   Right eye Left eye Both eyes  Without correction: 20/30 20/30   With correction:        MUSCULOSKELETAL:  Gait, strength, tone, movements noted in Neurologic exam below  NEUROLOGIC: MENTAL STATUS:  No flowsheet data found.  awake, alert, oriented to person, place and time  recent and remote memory intact  normal attention and concentration  language fluent, comprehension intact, naming intact  fund of knowledge appropriate  CRANIAL NERVE:   2nd - no papilledema on fundoscopic exam  2nd, 3rd, 4th, 6th - pupils equal and reactive to light, visual fields full to confrontation, extraocular muscles intact, no nystagmus  5th - facial sensation symmetric  7th - facial strength symmetric  8th - hearing intact  9th - palate elevates symmetrically, uvula midline  11th - shoulder shrug symmetric  12th - tongue protrusion midline  MOTOR:   normal bulk and tone, full strength in the BUE, BLE  SENSORY:   normal and symmetric to light touch, temperature, vibration  COORDINATION:   finger-nose-finger, fine finger  movements normal  REFLEXES:   deep tendon reflexes present and symmetric  GAIT/STATION:   narrow based gait     DIAGNOSTIC DATA (LABS, IMAGING, TESTING) - I reviewed patient records, labs, notes, testing and imaging myself where available.  Lab Results  Component Value Date   WBC 12.5 (H) 04/28/2012   HGB 9.5 (L) 04/28/2012   HCT 29.0 (L) 04/28/2012   MCV 79.2 04/28/2012   PLT 170 04/28/2012      Component Value Date/Time   NA 136 10/28/2011 1930   K 3.8 10/28/2011 1930   CL 100 10/28/2011 1930   CO2 25 10/28/2011 1930   GLUCOSE 98 10/28/2011 1930   BUN 8 10/28/2011 1930   CREATININE 0.51 10/28/2011 1930   CALCIUM 9.7 10/28/2011 1930  PROT 7.0 10/28/2011 1930   ALBUMIN 3.3 (L) 10/28/2011 1930   AST 17 10/28/2011 1930   ALT 15 10/28/2011 1930   ALKPHOS 45 10/28/2011 1930   BILITOT 0.8 10/28/2011 1930   GFRNONAA >90 10/28/2011 1930   GFRAA >90 10/28/2011 1930   No results found for: CHOL, HDL, LDLCALC, LDLDIRECT, TRIG, CHOLHDL No results found for: ZOXW9U No results found for: VITAMINB12 No results found for: TSH   09/28/14 CT head [I reviewed images myself and agree with interpretation. -[facility]P]  - No acute intracranial process ; normal noncontrast CT of the head.    ASSESSMENT AND PLAN  37 y.o. year old female here with history of headaches since 2017 with migraine features.  We will proceed with migraine treatment.  Recommended to consider MRI of the brain due to new onset of headaches at this stage of life.  However patient would like to hold off on scan think about it.   Dx:  1. Migraine without aura and without status migrainosus, not intractable     PLAN:  - consider MRI brain (patient will think about it)  - start topiramate 50mg  at bedtime; after 1 week increase to twice a day; drink plenty of water  - rizatriptan 10mg  as needed for breakthrough headache; may repeat x 1 after 2 hours; max 2 tabs per day or 8 per month  Meds ordered this  encounter  Medications  . topiramate (TOPAMAX) 50 MG tablet    Sig: Take 1 tablet (50 mg total) by mouth 2 (two) times daily.    Dispense:  60 tablet    Refill:  12  . rizatriptan (MAXALT-MLT) 10 MG disintegrating tablet    Sig: Take 1 tablet (10 mg total) by mouth as needed for migraine. May repeat in 2 hours if needed    Dispense:  9 tablet    Refill:  11   Return in about 6 months (around 11/30/2018) for with NP.    Suanne Marker, MD 05/31/2018, 3:05 PM Certified in Neurology, Neurophysiology and Neuroimaging  [facility] [address], [address] [place], [place] [address] [phone]

## 2018-05-31 NOTE — Patient Instructions (Addendum)
-   consider MRI brain  - start topiramate 50mg  at bedtime; after 1 week increase to twice a day; drink plenty of water  - rizatriptan 10mg  as needed for breakthrough headache; may repeat x 1 after 2 hours; max 2 tabs per day or 8 per month  - To prevent or relieve headaches, try the following:   Cool Compress. Lie down and place a cool compress on your head.   Avoid headache triggers. If certain foods or odors seem to have triggered your migraines in the past, avoid them. A headache diary might help you identify triggers.   Include physical activity in your daily routine.   Manage stress. Find healthy ways to cope with the stressors, such as delegating tasks on your to-do list.   Practice relaxation techniques. Try deep breathing, yoga, massage and visualization.   Eat regularly. Eating regularly scheduled meals and maintaining a healthy diet might help prevent headaches. Also, drink plenty of fluids.   Follow a regular sleep schedule. Sleep deprivation might contribute to headaches  Consider biofeedback. With this mind-body technique, you learn to control certain bodily functions - such as muscle tension, heart rate and blood pressure - to prevent headaches or reduce headache pain.

## 2018-11-14 NOTE — Progress Notes (Deleted)
PATIENT: Kirsten Kramer DOB: 1981/02/13  REASON FOR VISIT: follow up HISTORY FROM: patient  No chief complaint on file.    HISTORY OF PRESENT ILLNESS: Today 11/14/18 Kirsten Kramer is a 38 y.o. female here today for follow up for migraines.     HISTORY: (copied from  note on 05/31/2018) 38 year old female here for evaluation of headaches.  Symptoms started in 2017 she describes left greater than right side, unilateral, sometimes bilateral throbbing aching headaches lasting hours at a time.  Sometimes associated with nausea and vomiting and blurred vision.  Sometimes having sensitive to light.   Patient was prescribed topiramate, however patient took this on an as-needed basis and did not find relief.  Otherwise patient has tried ibuprofen with mild relief.  No specific triggering or aggravating factors.  No family history of headaches or migraines.  No similar headaches in the past.  REVIEW OF SYSTEMS: Out of a complete 14 system review of symptoms, the patient complains only of the following symptoms, and all other reviewed systems are negative.  ALLERGIES: No Known Allergies  HOME MEDICATIONS: Outpatient Medications Prior to Visit  Medication Sig Dispense Refill  . famotidine (PEPCID) 20 MG tablet Take 1 tablet (20 mg total) by mouth 2 (two) times daily. 60 tablet 6  . Iron-FA-B Cmp-C-Biot-Probiotic (FUSION PLUS) CAPS Take 1 capsule by mouth daily before breakfast. (Patient not taking: Reported on 05/31/2018) 30 capsule 5  . ondansetron (ZOFRAN) 4 MG tablet Take 1 tablet (4 mg total) by mouth every 6 (six) hours. (Patient not taking: Reported on 05/31/2018) 12 tablet 0  . Prenatal Vit-Fe Fumarate-FA (PRENATAL MULTIVITAMIN) TABS Take 1 tablet by mouth daily.    . rizatriptan (MAXALT-MLT) 10 MG disintegrating tablet Take 1 tablet (10 mg total) by mouth as needed for migraine. May repeat in 2 hours if needed 9 tablet 11  . topiramate (TOPAMAX) 50 MG tablet Take 1  tablet (50 mg total) by mouth 2 (two) times daily. 60 tablet 12   No facility-administered medications prior to visit.     PAST MEDICAL HISTORY: Past Medical History:  Diagnosis Date  . Allergic rhinitis   . Dyslipidemia   . Frequent headaches   . No pertinent past medical history   . Vitamin D deficiency     PAST SURGICAL HISTORY: Past Surgical History:  Procedure Laterality Date  . CESAREAN SECTION  04/27/2012   Procedure: CESAREAN SECTION;  Surgeon: Antionette Char, MD;  Location: [facility];  Service: Gynecology;  Laterality: N/A;  . NO PAST SURGERIES      FAMILY HISTORY: Family History  Problem Relation Age of Onset  . Cancer Father        prostate  . Diabetes Father   . Anesthesia problems Neg Hx     SOCIAL HISTORY: Social History   Socioeconomic History  . Marital status: Married    Spouse name: Not on file  . Number of children: 3  . Years of education: Not on file  . Highest education level: Not on file  Occupational History    Comment: [employer], cook  Social Needs  . Financial resource strain: Not on file  . Food insecurity:    Worry: Not on file    Inability: Not on file  . Transportation needs:    Medical: Not on file    Non-medical: Not on file  Tobacco Use  . Smoking status: Never Smoker  . Smokeless tobacco: Never Used  Substance and Sexual Activity  . Alcohol  use: No  . Drug use: No  . Sexual activity: Yes    Birth control/protection: None    Comment: IUD removed in October  Lifestyle  . Physical activity:    Days per week: Not on file    Minutes per session: Not on file  . Stress: Not on file  Relationships  . Social connections:    Talks on phone: Not on file    Gets together: Not on file    Attends religious service: Not on file    Active member of club or organization: Not on file    Attends meetings of clubs or organizations: Not on file    Relationship status: Not on file  . Intimate partner violence:    Fear of  current or ex partner: Not on file    Emotionally abused: Not on file    Physically abused: Not on file    Forced sexual activity: Not on file  Other Topics Concern  . Not on file  Social History Narrative   Lives with children   Caffeine- coffee      PHYSICAL EXAM  There were no vitals filed for this visit. There is no height or weight on file to calculate BMI.  Generalized: Well developed, in no acute distress  Cardiology: normal rate and rhythm, no murmur noted Neurological examination  Mentation: Alert oriented to time, place, history taking. Follows all commands speech and language fluent Cranial nerve II-XII: Pupils were equal round reactive to light. Extraocular movements were full, visual field were full on confrontational test. Facial sensation and strength were normal. Uvula tongue midline. Head turning and shoulder shrug  were normal and symmetric. Motor: The motor testing reveals 5 over 5 strength of all 4 extremities. Good symmetric motor tone is noted throughout.  Sensory: Sensory testing is intact to soft touch on all 4 extremities. No evidence of extinction is noted.  Coordination: Cerebellar testing reveals good finger-nose-finger and heel-to-shin bilaterally.  Gait and station: Gait is normal. Tandem gait is normal. Romberg is negative. No drift is seen.  Reflexes: Deep tendon reflexes are symmetric and normal bilaterally.   DIAGNOSTIC DATA (LABS, IMAGING, TESTING) - I reviewed patient records, labs, notes, testing and imaging myself where available.  No flowsheet data found.   Lab Results  Component Value Date   WBC 12.5 (H) 04/28/2012   HGB 9.5 (L) 04/28/2012   HCT 29.0 (L) 04/28/2012   MCV 79.2 04/28/2012   PLT 170 04/28/2012      Component Value Date/Time   NA 136 10/28/2011 1930   K 3.8 10/28/2011 1930   CL 100 10/28/2011 1930   CO2 25 10/28/2011 1930   GLUCOSE 98 10/28/2011 1930   BUN 8 10/28/2011 1930   CREATININE 0.51 10/28/2011 1930    CALCIUM 9.7 10/28/2011 1930   PROT 7.0 10/28/2011 1930   ALBUMIN 3.3 (L) 10/28/2011 1930   AST 17 10/28/2011 1930   ALT 15 10/28/2011 1930   ALKPHOS 45 10/28/2011 1930   BILITOT 0.8 10/28/2011 1930   GFRNONAA >90 10/28/2011 1930   GFRAA >90 10/28/2011 1930   No results found for: CHOL, HDL, LDLCALC, LDLDIRECT, TRIG, CHOLHDL No results found for: ZOXW9UHGBA1C No results found for: VITAMINB12 No results found for: TSH     ASSESSMENT AND PLAN 38 y.o. year old female  has a past medical history of Allergic rhinitis, Dyslipidemia, Frequent headaches, No pertinent past medical history, and Vitamin D deficiency. here with ***  No diagnosis found.  No orders of the defined types were placed in this encounter.    No orders of the defined types were placed in this encounter.     I spent 15 minutes with the patient. 50% of this time was spent counseling and educating patient on plan of care and medications.    Shawnie Dapper, FNP-C 11/14/2018, 10:58 AM [facility] [address] [place], [place] [address] [phone]

## 2018-11-15 ENCOUNTER — Ambulatory Visit: Payer: BC Managed Care – PPO | Admitting: Family Medicine

## 2018-11-15 ENCOUNTER — Ambulatory Visit: Payer: BC Managed Care – PPO | Admitting: Nurse Practitioner

## 2020-02-08 ENCOUNTER — Other Ambulatory Visit: Payer: Self-pay | Admitting: Internal Medicine

## 2020-02-08 DIAGNOSIS — Z1231 Encounter for screening mammogram for malignant neoplasm of breast: Secondary | ICD-10-CM

## 2020-04-17 ENCOUNTER — Emergency Department (HOSPITAL_COMMUNITY): Payer: BC Managed Care – PPO

## 2020-04-17 ENCOUNTER — Telehealth (HOSPITAL_COMMUNITY): Payer: Self-pay | Admitting: Nurse Practitioner

## 2020-04-17 ENCOUNTER — Emergency Department (HOSPITAL_COMMUNITY)
Admission: EM | Admit: 2020-04-17 | Discharge: 2020-04-17 | Discharge status: Against Medical Advice | Payer: BC Managed Care – PPO | Attending: Emergency Medicine | Admitting: Emergency Medicine

## 2020-04-17 ENCOUNTER — Encounter (HOSPITAL_COMMUNITY): Payer: Self-pay

## 2020-04-17 ENCOUNTER — Other Ambulatory Visit: Payer: Self-pay

## 2020-04-17 DIAGNOSIS — R0989 Other specified symptoms and signs involving the circulatory and respiratory systems: Secondary | ICD-10-CM | POA: Insufficient documentation

## 2020-04-17 DIAGNOSIS — R509 Fever, unspecified: Secondary | ICD-10-CM | POA: Diagnosis not present

## 2020-04-17 DIAGNOSIS — U071 COVID-19: Secondary | ICD-10-CM | POA: Diagnosis not present

## 2020-04-17 DIAGNOSIS — R0602 Shortness of breath: Secondary | ICD-10-CM | POA: Diagnosis not present

## 2020-04-17 DIAGNOSIS — R05 Cough: Secondary | ICD-10-CM | POA: Diagnosis present

## 2020-04-17 DIAGNOSIS — Z20822 Contact with and (suspected) exposure to covid-19: Secondary | ICD-10-CM

## 2020-04-17 LAB — BASIC METABOLIC PANEL
Anion gap: 14 (ref 5–15)
BUN: 13 mg/dL (ref 6–20)
CO2: 25 mmol/L (ref 22–32)
Calcium: 8.9 mg/dL (ref 8.9–10.3)
Chloride: 102 mmol/L (ref 98–111)
Creatinine, Ser: 0.75 mg/dL (ref 0.44–1.00)
GFR calc Af Amer: >60 mL/min (ref >60)
GFR calc non Af Amer: >60 mL/min (ref >60)
Glucose, Bld: 120 mg/dL — ABNORMAL HIGH (ref 70–99)
Potassium: 3.6 mmol/L (ref 3.5–5.1)
Sodium: 141 mmol/L (ref 135–145)

## 2020-04-17 LAB — CBC
HCT: 42.5 % (ref 36.0–46.0)
Hemoglobin: 13.4 g/dL (ref 12.0–15.0)
MCH: 26.1 pg (ref 26.0–34.0)
MCHC: 31.5 g/dL (ref 30.0–36.0)
MCV: 82.7 fL (ref 80.0–100.0)
Platelets: 269 10*3/uL (ref 150–400)
RBC: 5.14 MIL/uL — ABNORMAL HIGH (ref 3.87–5.11)
RDW: 13.7 % (ref 11.5–15.5)
WBC: 5.7 10*3/uL (ref 4.0–10.5)
nRBC: 0 % (ref 0.0–0.2)

## 2020-04-17 LAB — SARS CORONAVIRUS 2 BY RT PCR (HOSPITAL ORDER, PERFORMED IN ~~LOC~~ HOSPITAL LAB): SARS Coronavirus 2: POSITIVE — AB

## 2020-04-17 LAB — I-STAT BETA HCG BLOOD, ED (NOT ORDERABLE): I-stat hCG, quantitative: <5 m[IU]/mL (ref <5)

## 2020-04-17 LAB — TROPONIN I (HIGH SENSITIVITY): Troponin I (High Sensitivity): <2 ng/L (ref <18)

## 2020-04-17 MED ORDER — DOXYCYCLINE HYCLATE 100 MG PO CAPS: 100.0000 mg | ORAL_CAPSULE | Freq: Two times a day (BID) | ORAL | 0 refills | Status: AC

## 2020-04-17 MED ORDER — ALBUTEROL SULFATE HFA 108 (90 BASE) MCG/ACT IN AERS: 2.0000 | INHALATION_SPRAY | Freq: Four times a day (QID) | RESPIRATORY_TRACT | 0 refills | Status: AC | PRN

## 2020-04-17 MED ORDER — BENZONATATE 100 MG PO CAPS
100.0000 mg | ORAL_CAPSULE | Freq: Three times a day (TID) | ORAL | 0 refills | Status: AC
Start: 2020-04-17 — End: 2020-04-22

## 2020-04-17 NOTE — ED Triage Notes (Signed)
Patient arrived stating she has been sick over the last few days and has had a nonproductive cough and some shortness of breath. Declines any NVD, dizziness.

## 2020-04-17 NOTE — Discharge Instructions (Addendum)
It is likely that you have a coronavirus infection.  A test was completed today and if the results are positive the hospital will contact you to let you know.  In the case that your test is negative, you are given a prescription for antibiotics to cover potential bacterial pneumonia.  You are also given an albuterol inhaler.  Use 2 puffs every 4-6 hours to help with your breathing.  You are also given cough medication.  You can rotate Tylenol and Motrin to help with your fevers.  I have reached out to the monoclonal antibody clinic to help get you set up with to receive an antibody infusion.  They should be reaching out to you to schedule an appointment for this.  Please obtain a pulse oximetry monitor so that you can monitor your oxygen at home.  If your oxygen drops below 90% then you should return to the emergency department for reassessment.  Please make an appointment to follow-up with your regular doctor in the next 2 to 3 days for reassessment and return to the emergency department for any new or worsening symptoms in the meantime.  If your COVID test is positive, You should be isolated for at least 7 days since the onset of your symptoms AND >72 hours after symptoms resolution (absence of fever without the use of fever reducing medicaiton and improvement in respiratory symptoms), whichever is longer

## 2020-04-17 NOTE — Telephone Encounter (Signed)
Called to Discuss with patient about Covid symptoms and the use of regeneron, a monoclonal antibody infusion for those with mild to moderate Covid symptoms and at a high risk of hospitalization.     Pt is qualified for this infusion at the [facility] due to co-morbid conditions and/or a member of an at-risk group.     Unable to reach pt. Left message to return call  Dalten Ambrosino, DNP, AGNP-C [phone] (Infusion Center Hotline)  

## 2020-04-17 NOTE — ED Provider Notes (Signed)
[facility]-EMERGENCY DEPT Provider Note   CSN: [id number] Arrival date & time: 04/17/20  0024     History Chief Complaint  Patient presents with  . Cough    Kirsten Kramer is a 39 y.o. female.  HPI    Pt is a 39 y/o female with a h/o HLD, who presents the ED for eval of cough, fevers, and SOB that have been on going for the last 5 days. Denies any chest pain or pain with inspiration. Denies hemoptysis, BLE swelling, or calf pain. Denies abd pain, NVD.    Denies any known COVID contacts. Has not have her COVID vaccine.   Past Medical History:  Diagnosis Date  . Allergic rhinitis   . Dyslipidemia   . Frequent headaches   . No pertinent past medical history   . Vitamin D deficiency     Patient Active Problem List   Diagnosis Date Noted  . Cesarean delivery delivered 04/29/2012  . Anemia due to blood loss, acute 04/29/2012  . Breech presentation 04/27/2012    Past Surgical History:  Procedure Laterality Date  . CESAREAN SECTION  04/27/2012   Procedure: CESAREAN SECTION;  Surgeon: Antionette Char, MD;  Location: [facility];  Service: Gynecology;  Laterality: N/A;  . NO PAST SURGERIES       OB History    Gravida  3   Para  3   Term  3   Preterm  0   AB  0   Living  3     SAB  0   TAB  0   Ectopic  0   Multiple  0   Live Births  3           Family History  Problem Relation Age of Onset  . Cancer Father        prostate  . Diabetes Father   . Anesthesia problems Neg Hx     Social History   Tobacco Use  . Smoking status: Never Smoker  . Smokeless tobacco: Never Used  Substance Use Topics  . Alcohol use: No  . Drug use: No    Home Medications Prior to Admission medications   Medication Sig Start Date End Date Taking? Authorizing Provider  albuterol (VENTOLIN HFA) 108 (90 Base) MCG/ACT inhaler Inhale 2 puffs into the lungs every 6 (six) hours as needed for wheezing or shortness of breath. 04/17/20   Brazen Domangue  S, PA-C  benzonatate (TESSALON) 100 MG capsule Take 1 capsule (100 mg total) by mouth every 8 (eight) hours for 5 days. 04/17/20 04/22/20  Catherene Kaleta S, PA-C  doxycycline (VIBRAMYCIN) 100 MG capsule Take 1 capsule (100 mg total) by mouth 2 (two) times daily for 7 days. 04/17/20 04/24/20  Tyrisha Benninger S, PA-C  famotidine (PEPCID) 20 MG tablet Take 1 tablet (20 mg total) by mouth 2 (two) times daily. 10/28/11 10/27/12  Archie Patten, CNM  Iron-FA-B Cmp-C-Biot-Probiotic (FUSION PLUS) CAPS Take 1 capsule by mouth daily before breakfast. Patient not taking: Reported on 05/31/2018 04/30/12   Brock Bad, MD  ondansetron (ZOFRAN) 4 MG tablet Take 1 tablet (4 mg total) by mouth every 6 (six) hours. Patient not taking: Reported on 05/31/2018 09/28/14   Marlon Pel, PA-C  Prenatal Vit-Fe Fumarate-FA (PRENATAL MULTIVITAMIN) TABS Take 1 tablet by mouth daily.    [provider]  rizatriptan (MAXALT-MLT) 10 MG disintegrating tablet Take 1 tablet (10 mg total) by mouth as needed for migraine. May repeat in 2 hours  if needed 05/31/18   Penumalli, Glenford Bayley, MD  topiramate (TOPAMAX) 50 MG tablet Take 1 tablet (50 mg total) by mouth 2 (two) times daily. 05/31/18   Penumalli, Glenford Bayley, MD    Allergies    Patient has no known allergies.  Review of Systems   Review of Systems  Constitutional: Negative for fever.  HENT: Negative for ear pain and sore throat.   Eyes: Negative for pain and visual disturbance.  Respiratory: Positive for cough and shortness of breath.   Cardiovascular: Negative for chest pain and leg swelling.  Gastrointestinal: Negative for abdominal pain, constipation, diarrhea, nausea and vomiting.  Genitourinary: Negative for dysuria and hematuria.  Musculoskeletal: Negative for back pain.  Skin: Negative for rash.  Neurological: Negative for seizures and syncope.  All other systems reviewed and are negative.   Physical Exam Updated Vital Signs BP 129/78 (BP Location:  Right Arm)   Pulse 62   Temp 98.1 F (36.7 C) (Oral)   Resp 16   Wt 83.1 kg   LMP 04/16/2020   SpO2 99%   BMI 32.43 kg/m   Physical Exam Vitals and nursing note reviewed.  Constitutional:      General: She is not in acute distress.    Appearance: She is well-developed.  HENT:     Head: Normocephalic and atraumatic.  Eyes:     Conjunctiva/sclera: Conjunctivae normal.  Cardiovascular:     Rate and Rhythm: Normal rate and regular rhythm.     Heart sounds: Normal heart sounds. No murmur heard.   Pulmonary:     Effort: Pulmonary effort is normal. No respiratory distress.     Breath sounds: Rales present.  Abdominal:     General: Bowel sounds are normal.     Palpations: Abdomen is soft.     Tenderness: There is no abdominal tenderness. There is no guarding or rebound.  Musculoskeletal:     Cervical back: Neck supple.  Skin:    General: Skin is warm and dry.  Neurological:     Mental Status: She is alert.     ED Results / Procedures / Treatments   Labs (all labs ordered are listed, but only abnormal results are displayed) Labs Reviewed  BASIC METABOLIC PANEL - Abnormal; Notable for the following components:      Result Value   Glucose, Bld 120 (*)    All other components within normal limits  CBC - Abnormal; Notable for the following components:   RBC 5.14 (*)    All other components within normal limits  SARS CORONAVIRUS 2 BY RT PCR (HOSPITAL ORDER, PERFORMED IN [facility])  I-STAT BETA HCG BLOOD, ED ([facility], [facility], [facility] ONLY)  I-STAT BETA HCG BLOOD, ED (NOT ORDERABLE)  TROPONIN I (HIGH SENSITIVITY)  TROPONIN I (HIGH SENSITIVITY)    EKG None  Radiology DG Chest 2 View  Result Date: 04/17/2020 CLINICAL DATA:  Shortness of breath.  Cough. EXAM: CHEST - 2 VIEW COMPARISON:  None. FINDINGS: Heart is normal in size. Normal mediastinal contours. Patchy heterogeneous bilateral airspace disease. No pleural fluid or pneumothorax. No acute osseous abnormalities are  seen. IMPRESSION: Patchy heterogeneous bilateral airspace disease, suspicious for multifocal pneumonia, pattern typically seen with COVID-19 pneumonia. Recommend correlation with laboratory values. Electronically Signed   By: Narda Rutherford M.D.   On: 04/17/2020 01:59    Procedures Procedures (including critical care time)  Medications Ordered in ED Medications - No data to display  ED Course  I have reviewed the triage  vital signs and the nursing notes.  Pertinent labs & imaging results that were available during my care of the patient were reviewed by me and considered in my medical decision making (see chart for details).    MDM Rules/Calculators/A&P                          Patient presenting for evaluation for Covid sxs.  Reports symptoms ongoing for 5 days.  Patient nontoxic, well-appearing, no distress.  Vital signs are reassuring.  Chest x-ray showed : Patchy heterogeneous bilateral airspace disease, suspicious for multifocal pneumonia, pattern typically seen with COVID-19 pneumonia. .  Tested for Covid in the ED. Results pending at the time of discharge. Reviewed/interpreted labs which showed no anemia, normal electrolytes, kidney and liver function. Beta hcg neg. ekg nonischemic. Pt ambulated in the room and very briefly desatted to 88-89% on ra but sats improved after about 1 min. Pt was not significantly symptomatic with this. Feel she does not require admission at this time. This was discussed with supervising physician Dr. Jeraldine Loots who is in agreement with the plan for discharge. She will be given symptomatic meds and infusion clinic will be contacted to set up MAB infusion.  Additionally I gave pt rx for doxycycline for possible concurrent bacterial infection given she stated she had a neg covid test yesterday. Advised on quarantine measures. Will give Rx for symptomatic management. Advised on f/u and return precautions. Pt voiced understanding of the plan and reasons to return.  All questions answered, pt stable for d/c.   Kirsten Kramer was evaluated in Emergency Department on 04/17/2020 for the symptoms described in the history of present illness. She was evaluated in the context of the global COVID-19 pandemic, which necessitated consideration that the patient might be at risk for infection with the SARS-CoV-2 virus that causes COVID-19. Institutional protocols and algorithms that pertain to the evaluation of patients at risk for COVID-19 are in a state of rapid change based on information released by regulatory bodies including the CDC and federal and state organizations. These policies and algorithms were followed during the patient's care in the ED.   Final Clinical Impression(s) / ED Diagnoses Final diagnoses:  Suspected COVID-19 virus infection    Rx / DC Orders ED Discharge Orders         Ordered    albuterol (VENTOLIN HFA) 108 (90 Base) MCG/ACT inhaler  Every 6 hours PRN     Discontinue  Reprint     04/17/20 1501    benzonatate (TESSALON) 100 MG capsule  Every 8 hours     Discontinue  Reprint     04/17/20 1501    doxycycline (VIBRAMYCIN) 100 MG capsule  2 times daily     Discontinue  Reprint     04/17/20 1501           Karrie Meres, PA-C 04/17/20 1511    Gerhard Munch, MD 04/17/20 1546

## 2020-04-23 ENCOUNTER — Ambulatory Visit: Payer: Self-pay

## 2020-05-08 ENCOUNTER — Ambulatory Visit
Admission: RE | Admit: 2020-05-08 | Discharge: 2020-05-08 | Disposition: A | Discharge status: Home / Self Care | Payer: BC Managed Care – PPO | Source: Ambulatory Visit | Attending: Internal Medicine | Admitting: Internal Medicine

## 2020-05-08 ENCOUNTER — Other Ambulatory Visit: Payer: Self-pay

## 2020-05-08 DIAGNOSIS — Z1231 Encounter for screening mammogram for malignant neoplasm of breast: Secondary | ICD-10-CM

## 2020-05-13 ENCOUNTER — Other Ambulatory Visit: Payer: Self-pay | Admitting: Internal Medicine

## 2020-05-13 DIAGNOSIS — R928 Other abnormal and inconclusive findings on diagnostic imaging of breast: Secondary | ICD-10-CM

## 2020-05-24 ENCOUNTER — Ambulatory Visit
Admission: RE | Admit: 2020-05-24 | Discharge: 2020-05-24 | Disposition: A | Discharge status: Home / Self Care | Payer: BC Managed Care – PPO | Source: Ambulatory Visit | Attending: Internal Medicine | Admitting: Internal Medicine

## 2020-05-24 ENCOUNTER — Other Ambulatory Visit: Payer: Self-pay | Admitting: Internal Medicine

## 2020-05-24 ENCOUNTER — Other Ambulatory Visit: Payer: Self-pay

## 2020-05-24 DIAGNOSIS — R928 Other abnormal and inconclusive findings on diagnostic imaging of breast: Secondary | ICD-10-CM

## 2020-11-25 ENCOUNTER — Other Ambulatory Visit: Payer: BC Managed Care – PPO

## 2020-12-11 ENCOUNTER — Other Ambulatory Visit: Payer: BC Managed Care – PPO

## 2021-01-15 ENCOUNTER — Other Ambulatory Visit: Payer: Self-pay | Admitting: Internal Medicine

## 2021-01-15 DIAGNOSIS — N6489 Other specified disorders of breast: Secondary | ICD-10-CM

## 2021-01-20 ENCOUNTER — Other Ambulatory Visit: Payer: Self-pay | Admitting: Internal Medicine

## 2021-01-20 ENCOUNTER — Ambulatory Visit
Admission: RE | Admit: 2021-01-20 | Discharge: 2021-01-20 | Disposition: A | Discharge status: Home / Self Care | Payer: BC Managed Care – PPO | Source: Ambulatory Visit | Attending: Internal Medicine | Admitting: Internal Medicine

## 2021-01-20 ENCOUNTER — Other Ambulatory Visit: Payer: Self-pay

## 2021-01-20 ENCOUNTER — Ambulatory Visit: Admission: RE | Admit: 2021-01-20 | Payer: Self-pay | Source: Ambulatory Visit

## 2021-01-20 DIAGNOSIS — N6489 Other specified disorders of breast: Secondary | ICD-10-CM

## 2021-05-26 ENCOUNTER — Other Ambulatory Visit: Payer: Self-pay

## 2021-05-26 ENCOUNTER — Ambulatory Visit
Admission: RE | Admit: 2021-05-26 | Discharge: 2021-05-26 | Disposition: A | Discharge status: Home / Self Care | Payer: BC Managed Care – PPO | Source: Ambulatory Visit | Attending: Internal Medicine | Admitting: Internal Medicine

## 2021-05-26 DIAGNOSIS — N6489 Other specified disorders of breast: Secondary | ICD-10-CM

## 2022-05-28 IMAGING — MG DIGITAL DIAGNOSTIC BILAT W/ TOMO W/ CAD
8 series · 8 of 24 positions shown · non-contrast
Comparison: Previous exam(s).

CLINICAL DATA: Follow-up probably benign asymmetry in the lower
inner left breast with no previous ultrasound correlate.

EXAM:
DIGITAL DIAGNOSTIC BILATERAL MAMMOGRAM WITH TOMOSYNTHESIS AND CAD
TECHNIQUE: Bilateral digital diagnostic mammography and breast tomosynthesis
was performed. The images were evaluated with computer-aided
detection.

[L CC synth-2D]
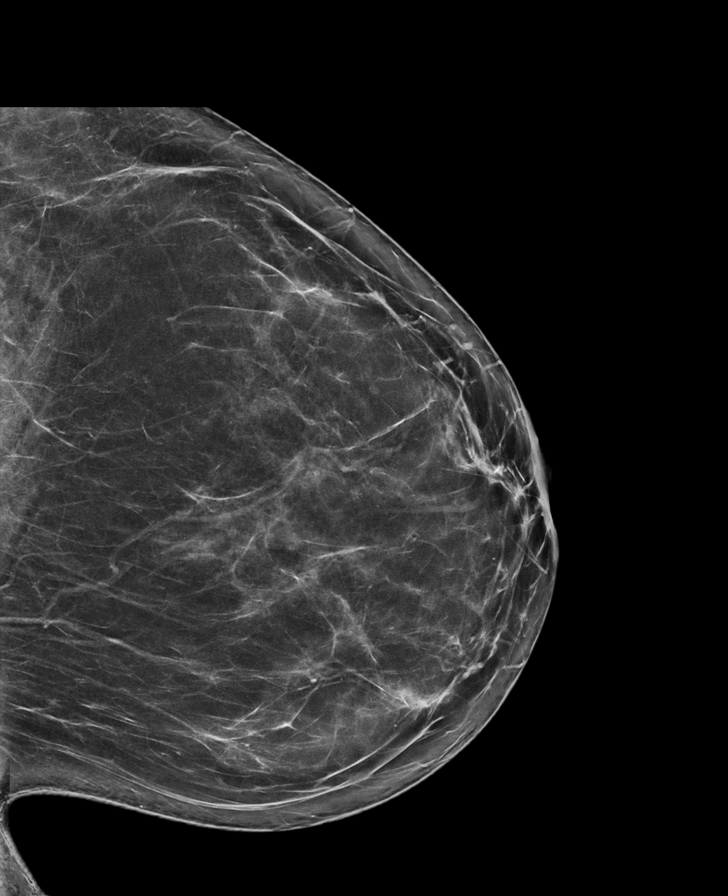

[R MLO synth-2D]
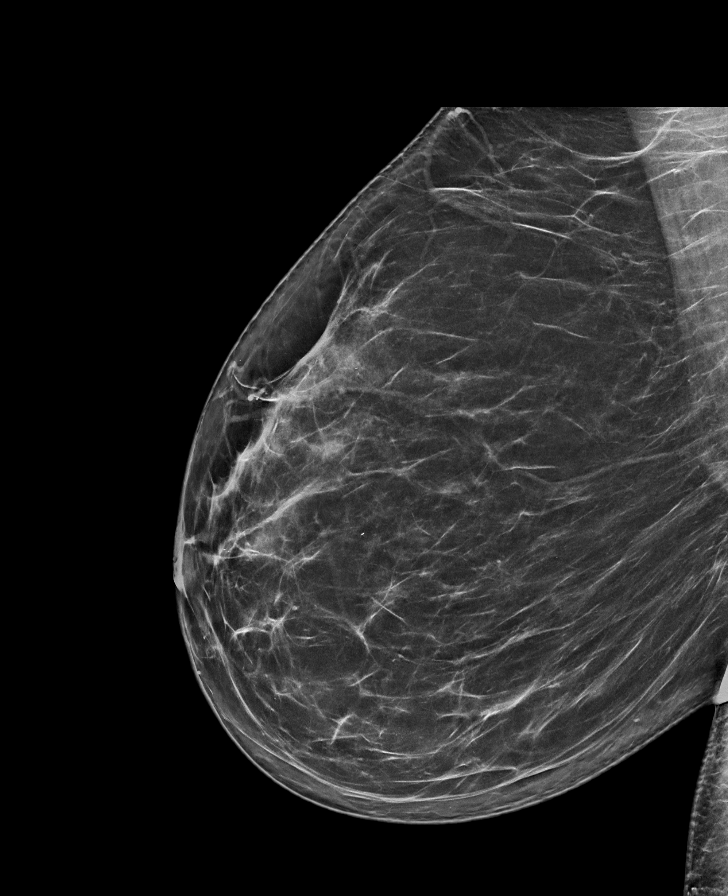

[R CC synth-2D]
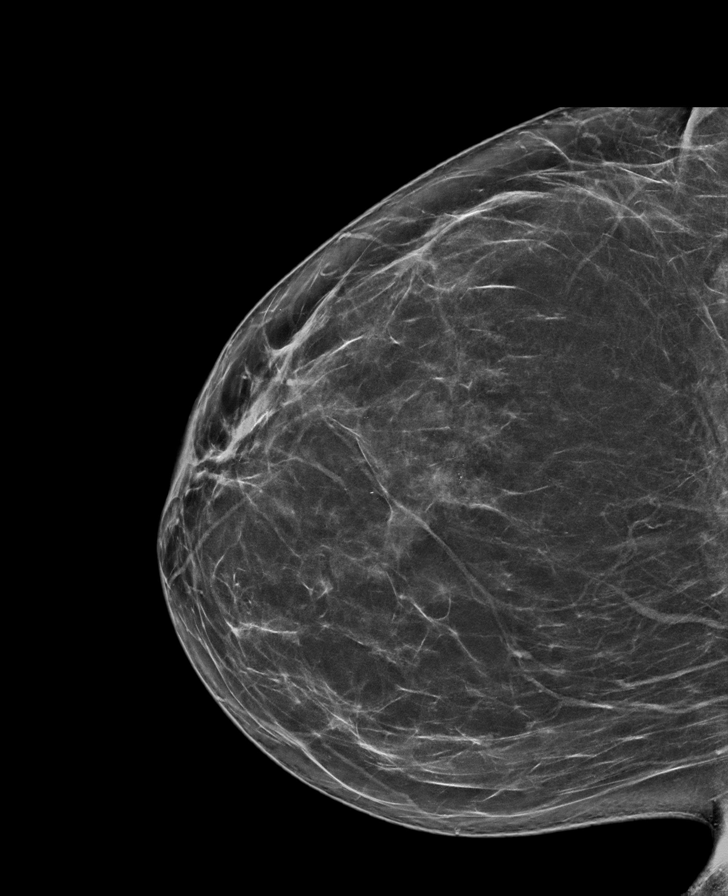

[L MLO synth-2D]
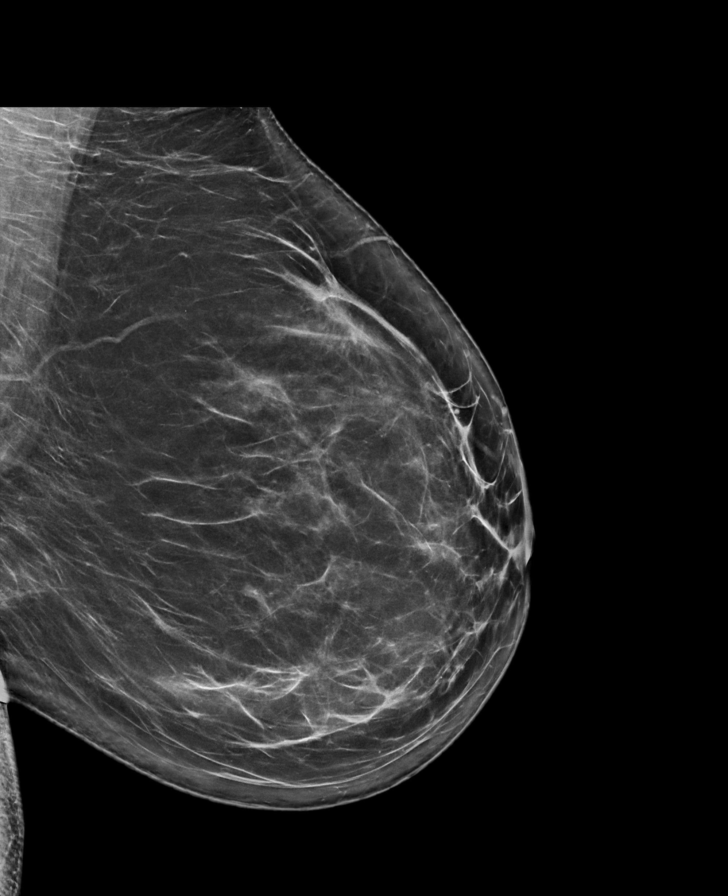

[R CC tomo · tomo slice 40/79.0]
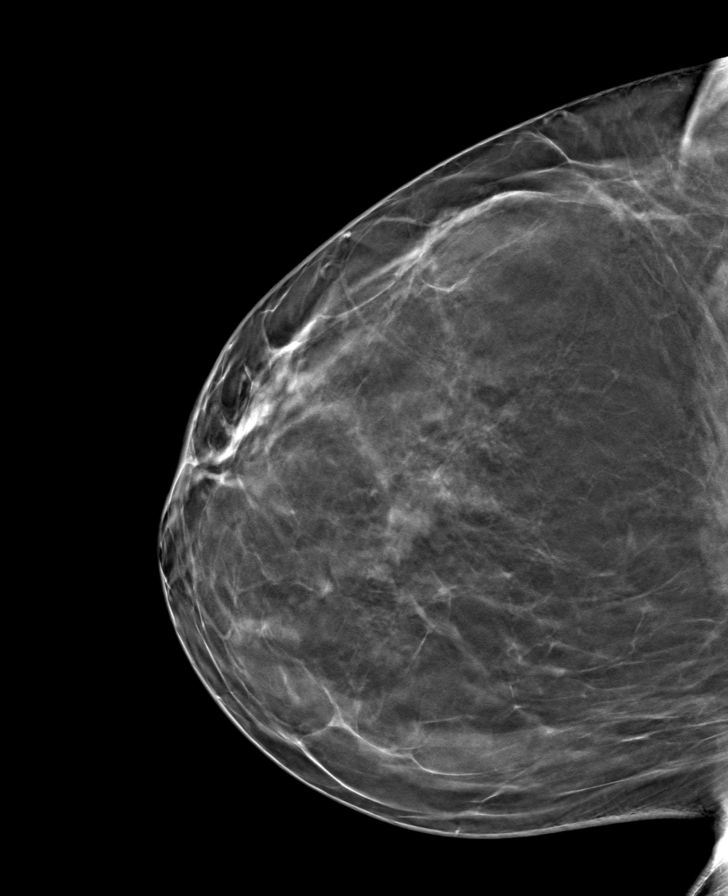

[L MLO tomo · tomo slice 45/88.0]
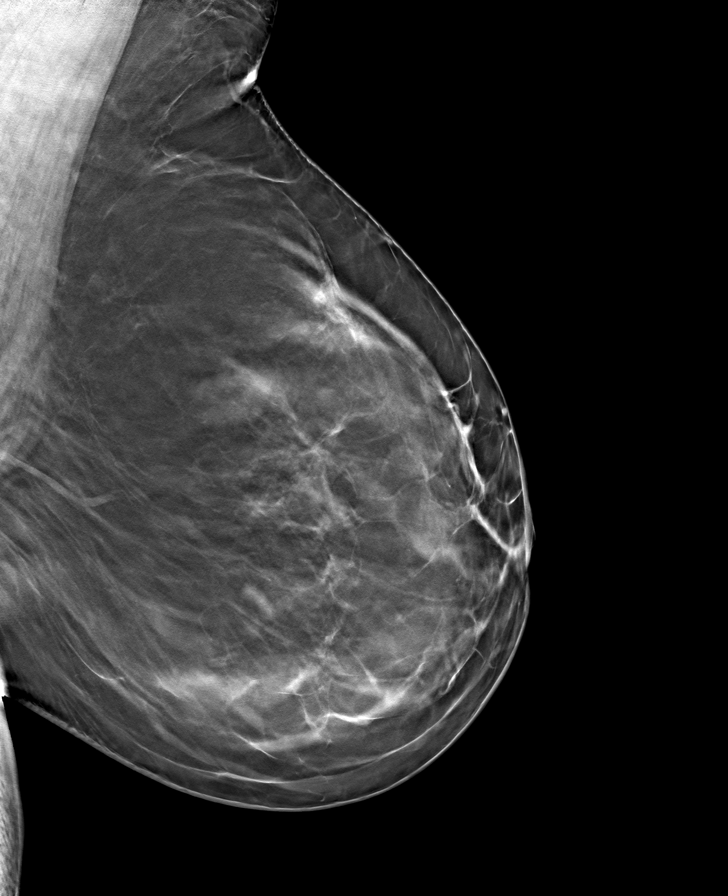

[R MLO tomo · tomo slice 43/85.0]
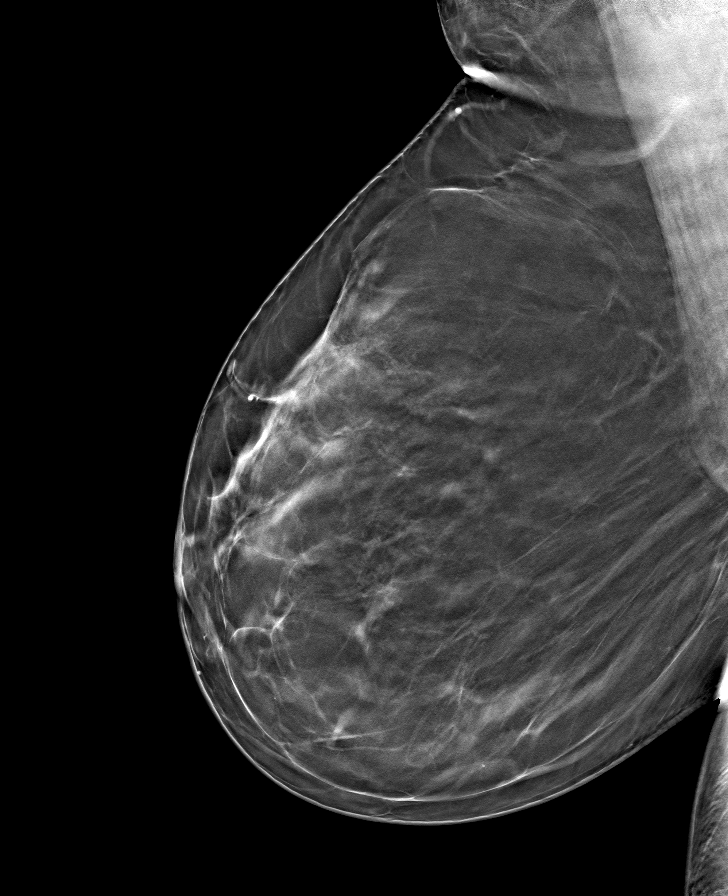

[L CC tomo · tomo slice 41/81.0]
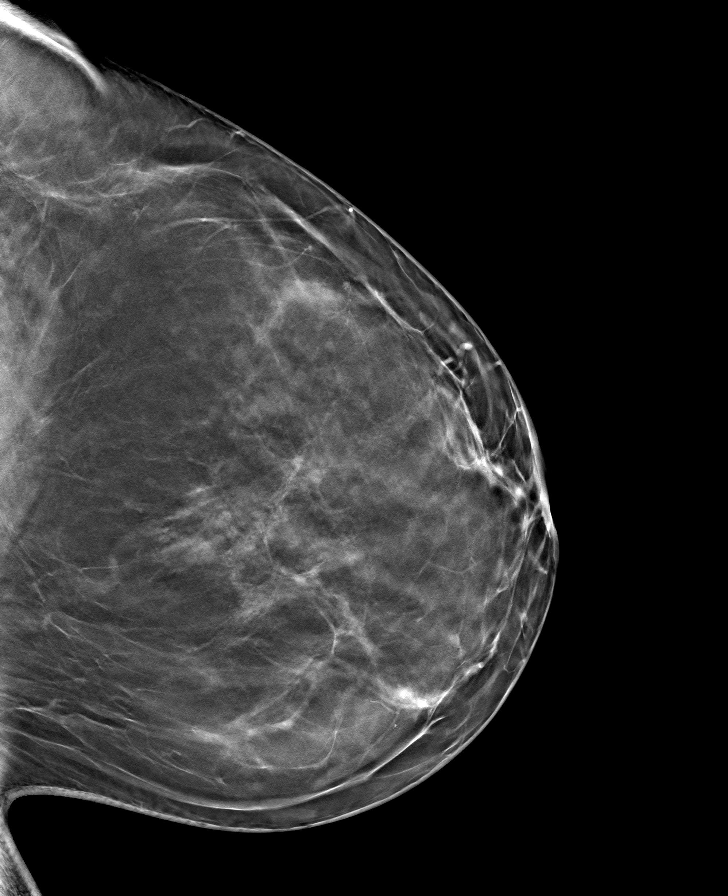

[8 of 24 positions shown; findings below may reference images not displayed]

ACR Breast Density Category b: There are scattered areas of
fibroglandular density.
FINDINGS: Mammographically normal appearing breasts, including the previously
suspected asymmetry in the lower inner left breast. That currently
has the appearance of normal fibroglandular tissue. No interval
findings suspicious for malignancy in either breast.
IMPRESSION: No evidence of malignancy. The recently suspected left breast
asymmetry was close apposition of normal breast tissue.

RECOMMENDATION:
Bilateral screening mammogram in 1 year.

I have discussed the findings and recommendations with the patient.
If applicable, a reminder letter will be sent to the patient
regarding the next appointment.

BI-RADS CATEGORY  1: Negative.

## 2022-12-08 ENCOUNTER — Other Ambulatory Visit: Payer: Self-pay | Admitting: Internal Medicine

## 2022-12-08 DIAGNOSIS — Z1231 Encounter for screening mammogram for malignant neoplasm of breast: Secondary | ICD-10-CM

## 2023-01-08 ENCOUNTER — Encounter (HOSPITAL_BASED_OUTPATIENT_CLINIC_OR_DEPARTMENT_OTHER): Payer: Worker's Compensation | Attending: Internal Medicine | Admitting: Internal Medicine

## 2023-01-08 DIAGNOSIS — Y990 Civilian activity done for income or pay: Secondary | ICD-10-CM | POA: Diagnosis not present

## 2023-01-08 DIAGNOSIS — X12XXXA Contact with other hot fluids, initial encounter: Secondary | ICD-10-CM | POA: Insufficient documentation

## 2023-01-08 DIAGNOSIS — T25222A Burn of second degree of left foot, initial encounter: Secondary | ICD-10-CM

## 2023-01-08 DIAGNOSIS — Y92219 Unspecified school as the place of occurrence of the external cause: Secondary | ICD-10-CM | POA: Insufficient documentation

## 2023-01-08 NOTE — Progress Notes (Signed)
[place], [place] ([id number]) [phone].pdf Page 1 of 4 Visit Report for 01/08/2023 Abuse Risk Screen Details Patient Name: Date of Service: Kirsten Kramer [facility] 01/08/2023 7:45 A M Medical Record Number: [id number] Patient Account Number: [id number] Date of Birth/Sex: Treating RN: [birth year] (42 y.o. Kirsten Kramer Primary Care Todd Jelinski: Jackie Plum Other Clinician: Referring Marleni Gallardo: Treating Lavern Maslow/Extender: Francee Gentile in Treatment: 0 Abuse Risk Screen Items Answer ABUSE RISK SCREEN: Has anyone close to you tried to hurt or harm you recentlyo No Do you feel uncomfortable with anyone in your familyo No Has anyone forced you do things that you didnt want to doo No Electronic Signature(s) Signed: 01/08/2023 12:57:43 PM By: Karie Schwalbe RN Entered By: Karie Schwalbe on 01/08/2023 07:59:06 -------------------------------------------------------------------------------- Activities of Daily Living Details Patient Name: Date of Service: Kirsten Kramer [facility] 01/08/2023 7:45 A M Medical Record Number: [id number] Patient Account Number: [id number] Date of Birth/Sex: Treating RN: Mar 14, 1981 (42 y.o. Kirsten Kramer Primary Care Crestina Strike: Jackie Plum Other Clinician: Referring Sila Sarsfield: Treating Geanette Buonocore/Extender: Francee Gentile in Treatment: 0 Activities of Daily Living Items Answer Activities of Daily Living (Please select one for each item) Drive Automobile Completely Able T Medications ake Completely Able Use T elephone Completely Able Care for Appearance Completely Able Use T oilet Completely Able Bath / Shower Completely Able Dress Self Completely Able Feed Self Completely Able Walk Completely Able Get In / Out Bed Completely Able Housework Completely Able Prepare Meals Completely Able Handle Money Completely Able Shop for Self Completely Able Electronic  Signature(s) Signed: 01/08/2023 12:57:43 PM By: Karie Schwalbe RN Entered By: Karie Schwalbe on 01/08/2023 08:00:05 -------------------------------------------------------------------------------- Education Screening Details Patient Name: Date of Service: Kirsten Kramer [facility] 01/08/2023 7:45 A M Medical Record Number: [id number] Patient Account Number: [id number] Date of Birth/Sex: Treating RN: [birth year] (42 y.o. Kirsten Kramer Primary Care Dwan Hemmelgarn: Jackie Plum Other Clinician: Referring Maclain Cohron: Treating Kirsten Kramer/Extender: Francee Gentile in Treatment: 0 [place], [place] ([id number]) [phone].pdf Page 2 of 4 Primary Learner Assessed: Patient Learning Preferences/Education Level/Primary Language Learning Preference: Explanation, Demonstration, Printed Material Highest Education Level: High School Preferred Language: Economist Language Barrier: No Translator Needed: No Memory Deficit: No Emotional Barrier: No Cultural/Religious Beliefs Affecting Medical Care: No Physical Barrier Impaired Vision: No Impaired Hearing: No Decreased Hand dexterity: No Knowledge/Comprehension Knowledge Level: High Comprehension Level: High Ability to understand written instructions: High Ability to understand verbal instructions: High Motivation Anxiety Level: Calm Cooperation: Cooperative Education Importance: Acknowledges Need Interest in Health Problems: Asks Questions Perception: Coherent Willingness to Engage in Self-Management High Activities: Readiness to Engage in Self-Management High Activities: Electronic Signature(s) Signed: 01/08/2023 12:57:43 PM By: Karie Schwalbe RN Entered By: Karie Schwalbe on 01/08/2023 08:00:31 -------------------------------------------------------------------------------- Fall Risk Assessment Details Patient Name: Date of Service: [name], Kirsten Kramer 01/08/2023 7:45 A  M Medical Record Number: [id number] Patient Account Number: [id number] Date of Birth/Sex: Treating RN: 1981-07-30 (42 y.o. Kirsten Kramer Primary Care Kirsten Kramer: Jackie Plum Other Clinician: Referring Evolet Salminen: Treating Qasim Diveley/Extender: Francee Gentile in Treatment: 0 Fall Risk Assessment Items Have you had 2 or more falls in the last 12 monthso 0 No Have you had any fall that resulted in injury in the last 12 monthso 0 No FALLS RISK SCREEN History of falling - immediate or within 3 months 0 No Secondary diagnosis (Do you have 2 or more medical diagnoseso) 0 No Ambulatory aid None/bed rest/wheelchair/nurse 0 Yes Crutches/cane/walker 0 No Furniture  0 No Intravenous therapy Access/Saline/Heparin Lock 0 No Gait/Transferring Normal/ bed rest/ wheelchair 0 Yes Weak (short steps with or without shuffle, stooped but able to lift head while walking, may seek 0 No support from furniture) Impaired (short steps with shuffle, may have difficulty arising from chair, head down, impaired 0 No balance) Mental Status Oriented to own ability 0 Yes Overestimates or forgets limitations 0 No Risk Level: Low Risk Score: 0 Tabar, Ioanna ([id number]) [id number].pdf Page 3 of 4 Electronic Signature(s) -------------------------------------------------------------------------------- Foot Assessment Details Patient Name: Date of Service: Kirsten Kramer [facility] 01/08/2023 7:45 A M Medical Record Number: [id number] Patient Account Number: [id number] Date of Birth/Sex: Treating RN: May 18, 1981 (42 y.o. Kirsten Kramer Primary Care Ahmir Bracken: Jackie Plum Other Clinician: Referring Dejanee Thibeaux: Treating Mee Macdonnell/Extender: Francee Gentile in Treatment: 0 Foot Assessment Items Site Locations + = Sensation present, - = Sensation absent, C = Callus, U = Ulcer R = Redness, W = Warmth, M = Maceration, PU =  Pre-ulcerative lesion F = Fissure, S = Swelling, D = Dryness Assessment Right: Left: Other Deformity: No No Prior Foot Ulcer: No No Prior Amputation: No No Charcot Joint: No No Ambulatory Status: Ambulatory Without Help Gait: Steady Electronic Signature(s) Signed: 01/08/2023 12:57:43 PM By: Karie Schwalbe RN Entered By: Karie Schwalbe on 01/08/2023 08:04:20 -------------------------------------------------------------------------------- Nutrition Risk Screening Details Patient Name: Date of Service: Kirsten Kramer [facility] 01/08/2023 7:45 A M Medical Record Number: [id number] Patient Account Number: [id number] Date of Birth/Sex: Treating RN: 1981/05/25 (42 y.o. Kirsten Kramer Primary Care Lilyanna Lunt: Jackie Plum Other Clinician: Referring Chantay Whitelock: Treating Jann Milkovich/Extender: Francee Gentile in Treatment: 0 Height (in): 65 Weight (lbs): 180 Body Mass Index (BMI): 30 Beamer, Sandhya ([id number]) [id number] Page 4 of 4 Nutrition Risk Screening Items Score Screening NUTRITION RISK SCREEN: I have an illness or condition that made me change the kind and/or amount of food I eat 0 No I eat fewer than two meals per day 0 No I eat few fruits and vegetables, or milk products 0 No I have three or more drinks of beer, liquor or wine almost every day 0 No I have tooth or mouth problems that make it hard for me to eat 0 No I don't always have enough money to buy the food I need 0 No I eat alone most of the time 0 No I take three or more different prescribed or over-the-counter drugs a day 0 No Without wanting to, I have lost or gained 10 pounds in the last six months 0 No I am not always physically able to shop, cook and/or feed myself 0 No Nutrition Protocols Good Risk Protocol 0 No interventions needed Moderate Risk Protocol High Risk Proctocol Risk Level: Good Risk Score: 0 Electronic Signature(s) Signed: 01/08/2023  12:57:43 PM By: Karie Schwalbe RN Entered By: Karie Schwalbe on 01/08/2023 08:00:58
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
## 2023-01-09 NOTE — Progress Notes (Addendum)
[place], [name] ([id number]) [id number] Page 1 of 7 Visit Report for 01/08/2023 Chief Complaint Document Details Patient Name: Date of Service: Kirsten Kramer [facility] 01/08/2023 7:45 A M Medical Record Number: [id number] Patient Account Number: [id number] Date of Birth/Sex: Treating RN: 1981-06-01 (42 y.o. F) Primary Care Provider: Jackie Plum Other Clinician: Referring Provider: Treating Provider/Extender: Francee Gentile in Treatment: 0 Information Obtained from: Patient Chief Complaint 01/08/2023; left foot wound Electronic Signature(s) Signed: 01/08/2023 12:03:31 PM By: Geralyn Corwin DO Entered By: Geralyn Corwin on 01/08/2023 09:20:36 -------------------------------------------------------------------------------- HPI Details Patient Name: Date of Service: Kirsten Kramer, [name] [facility] 01/08/2023 7:45 A M Medical Record Number: [id number] Patient Account Number: [id number] Date of Birth/Sex: Treating RN: 03-Jun-1981 (42 y.o. F) Primary Care Provider: Jackie Plum Other Clinician: Referring Provider: Treating Provider/Extender: Francee Gentile in Treatment: 0 History of Present Illness HPI Description: 01/08/2023 Ms. Peiton Anselmo is a 42 year old healthy female with no significant past medical history that presents to the clinic for a 3-week history of non healing burn to her left foot. 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 She has been following with her primary care physician for this issue and was started on Bactrim and has been using triple antibiotic ointment to the wound beds. She denies signs of infection. Electronic Signature(s) Signed: 01/08/2023 12:03:31 PM By: Geralyn Corwin DO Entered By: Geralyn Corwin on 01/08/2023 09:32:08 -------------------------------------------------------------------------------- Dressings and/or  debridement of burns; small Details Patient Name: Date of Service: Kirsten Kramer [facility] 01/08/2023 7:45 A M Medical Record Number: [id number] Patient Account Number: [id number] Date of Birth/Sex: Treating RN: [birth year] (42 y.o. Arta Silence Primary Care Provider: Jackie Plum Other Clinician: Referring Provider: Treating Provider/Extender: Francee Gentile in Treatment: 0 [place], [place] ([id number]) [id number] Page 2 of 7 Procedure Performed for: Wound #1 Left,Dorsal Foot Performed By: Physician Geralyn Corwin, DO Post Procedure Diagnosis Same as Pre-procedure Notes #3 curette used by doctor to debrided. Electronic Signature(s) Signed: 01/08/2023 12:03:31 PM By: Geralyn Corwin DO Signed: 01/08/2023 4:16:18 PM By: Shawn Stall RN, BSN Entered By: Shawn Stall on 01/08/2023 08:57:28 -------------------------------------------------------------------------------- Physical Exam Details Patient Name: Date of Service: Kirsten Kramer [facility] 01/08/2023 7:45 A M Medical Record Number: [id number] Patient Account Number: [id number] Date of Birth/Sex: Treating RN: 08-03-1981 (42 y.o. F) Primary Care Provider: Jackie Plum Other Clinician: Referring Provider: Treating Provider/Extender: Francee Gentile in Treatment: 0 Constitutional respirations regular, non-labored and within target range for patient.. Cardiovascular 2+ dorsalis pedis/posterior tibialis pulses. Psychiatric pleasant and cooperative. Notes Left foot: T the dorsal aspect there are couple scattered wounds with devitalized tissue and slough. No surrounding signs of infection including increased o warmth, erythema purulent drainage Electronic Signature(s) Signed: 01/08/2023 12:03:31 PM By: Geralyn Corwin DO Entered By: Geralyn Corwin on 01/08/2023  09:32:40 -------------------------------------------------------------------------------- Physician Orders Details Patient Name: Date of Service: Kirsten Kramer [facility] 01/08/2023 7:45 A M Medical Record Number: [id number] Patient Account Number: [id number] Date of Birth/Sex: Treating RN: Jan 06, 1981 (42 y.o. Arta Silence Primary Care Provider: Jackie Plum Other Clinician: Referring Provider: Treating Provider/Extender: Francee Gentile in Treatment: 0 Verbal / Phone Orders: No Diagnosis Coding ICD-10 Coding Code Description T25.222A Burn of second degree of left foot, initial encounter [place], [name] ([id number]) [id number] Page 3 of 7 Follow-up Appointments ppointment in 1 week. - Dr. Mikey Bussing 0845 01/15/2023 room 8 Friday Return  A Other: - bacitracin purchase over the counter. will be placed out of work for 2 weeks due to no pressure with wearing shoes that would cause pressue to wound. Anesthetic (In clinic) Topical Lidocaine 4% applied to wound bed Bathing/ Shower/ Hygiene May shower and wash wound with soap and water. Edema Control - Lymphedema / SCD / Other Elevate legs to the level of the heart or above for 30 minutes daily and/or when sitting for 3-4 times a day throughout the day. Off-Loading Other: - wear shoes that will not apply pressure to top of foot- wound location. Wound Treatment Wound #1 - Foot Wound Laterality: Dorsal, Left Cleanser: Soap and Water 1 x Per Day/30 Days Discharge Instructions: May shower and wash wound with dial antibacterial soap and water prior to dressing change. Topical: bacitracin 1 x Per Day/30 Days Discharge Instructions: apply directly to wound. Its is over the counter. Prim Dressing: Xeroform Occlusive Gauze Dressing, 4x4 in 1 x Per Day/30 Days ary Discharge Instructions: Apply to wound bed as instructed Secondary Dressing: Zetuvit Plus Silicone Border Dressing 4x4 (in/in) 1 x  Per Day/30 Days Discharge Instructions: Apply silicone border or bandaid over primary dressing as directed. Patient Medications llergies: No Known Drug Allergies A Notifications Medication Indication Start End applied for 01/08/2023 lidocaine debridements in clinic. DOSE topical 4 % cream - cream topical once daily Electronic Signature(s) Signed: 01/08/2023 12:03:31 PM By: Geralyn Corwin DO Entered By: Geralyn Corwin on 01/08/2023 09:40:06 -------------------------------------------------------------------------------- Problem List Details Patient Name: Date of Service: Kirsten Kramer, [name] DIJA 01/08/2023 7:45 A M Medical Record Number: [id number] Patient Account Number: [id number] Date of Birth/Sex: Treating RN: 04-23-1981 (42 y.o. Arta Silence Primary Care Provider: Jackie Plum Other Clinician: Referring Provider: Treating Provider/Extender: Francee Gentile in Treatment: 0 Active Problems ICD-10 Encounter Code Description Active Date MDM Diagnosis T25.222A Burn of second degree of left foot, initial encounter 01/08/2023 No Yes Inactive Problems [place], [place] ([id number]) [id number] Page 4 of 7 Resolved Problems Electronic Signature(s) Signed: 01/08/2023 12:03:31 PM By: Geralyn Corwin DO Entered By: Geralyn Corwin on 01/08/2023 09:19:40 -------------------------------------------------------------------------------- Progress Note Details Patient Name: Date of Service: Kirsten Kramer [facility] 01/08/2023 7:45 A M Medical Record Number: [id number] Patient Account Number: [id number] Date of Birth/Sex: Treating RN: October 26, 1980 (42 y.o. F) Primary Care Provider: Jackie Plum Other Clinician: Referring Provider: Treating Provider/Extender: Francee Gentile in Treatment: 0 Subjective Chief Complaint Information obtained from Patient 01/08/2023; left foot wound History of Present Illness  (HPI) 01/08/2023 Ms. Vernona Chestnutt is a 42 year old healthy female with no significant past medical history that presents to the clinic for a 3-week history of non healing burn to her left foot. 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 She has been following with her primary care physician for this issue and was started on Bactrim and has been using triple antibiotic ointment to the wound beds. She denies signs of infection. Patient History Allergies No Known Drug Allergies Family History Cancer - Father, Diabetes - Father. Social History Never smoker, Marital Status - Married, Alcohol Use - Never, Drug Use - No History, Caffeine Use - Never. 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 Medical A Surgical History Notes nd Constitutional Symptoms (General Health) vitamin D deficiency Review of Systems (ROS) Integumentary (Skin) Complains or has symptoms of Wounds - left foot  burn. Objective Constitutional respirations regular, non-labored and within target range for patient.. Vitals Time Taken: 7:57 AM, Height: 65 in, Source: Stated, Weight: 180 lbs, Source: Stated, BMI: 30, Temperature: 98.7 F, Pulse: 76 bpm, Respiratory Rate: 16 breaths/min, Blood Pressure: 120/80 mmHg. Cardiovascular 2+ dorsalis pedis/posterior tibialis pulses. Psychiatric pleasant and cooperative. [place], [name] ([id number]) [id number] Page 5 of 7 General Notes: Left foot: T the dorsal aspect there are couple scattered wounds with devitalized tissue and slough. No surrounding signs of infection including o increased warmth, erythema purulent drainage Integumentary (Hair, Skin) Wound #1 status is Open. Original cause of wound was Thermal Burn. The date acquired was: 12/18/2022. The wound is located on the Left,Dorsal Foot. The wound  measures 2.8cm length x 1.1cm width x 0.1cm depth; 2.419cm^2 area and 0.242cm^3 volume. There is Fat Layer (Subcutaneous Tissue) exposed. There is no tunneling or undermining noted. There is a medium amount of serosanguineous drainage noted. The wound margin is distinct with the outline attached to the wound base. There is small (1-33%) red, pink granulation within the wound bed. There is a large (67-100%) amount of necrotic tissue within the wound bed including Adherent Slough. The periwound skin appearance exhibited: Scarring. The periwound skin appearance did not exhibit: Callus, Crepitus, Excoriation, Induration, Rash, Dry/Scaly, Maceration, Atrophie Blanche, Cyanosis, Ecchymosis, Hemosiderin Staining, Mottled, Pallor, Rubor, Erythema. Assessment Active Problems ICD-10 Burn of second degree of left foot, initial encounter Patient presents with a 3-week history of nonhealing wound to her left foot secondary to burn from hot water. She states this occurred while she was working at a school. No signs of infection. I debrided nonviable tissue. I recommended bacitracin ointment to the wound bed covered with Xeroform to be changed daily. Follow-up in 1 week. Work note given to be out of work for the next 2 weeks. Procedures Wound #1 Pre-procedure diagnosis of Wound #1 is a 2nd degree Burn located on the Left,Dorsal Foot . An Dressings and/or debridement of burns; small procedure was performed by Geralyn Corwin, DO. Post procedure Diagnosis Wound #1: Same as Pre-Procedure Notes: #3 curette used by doctor to debrided. Plan Follow-up Appointments: Return Appointment in 1 week. - Dr. Mikey Bussing 0845 01/15/2023 room 8 Friday Other: - bacitracin purchase over the counter. will be placed out of work for 2 weeks due to no pressure with wearing shoes that would cause pressue to wound. Anesthetic: (In clinic) Topical Lidocaine 4% applied to wound bed Bathing/ Shower/ Hygiene: May shower and wash wound  with soap and water. Edema Control - Lymphedema / SCD / Other: Elevate legs to the level of the heart or above for 30 minutes daily and/or when sitting for 3-4 times a day throughout the day. Off-Loading: Other: - wear shoes that will not apply pressure to top of foot- wound location. The following medication(s) was prescribed: lidocaine topical 4 % cream cream topical once daily for applied for debridements in clinic. was prescribed at facility WOUND #1: - Foot Wound Laterality: Dorsal, Left Cleanser: Soap and Water 1 x Per Day/30 Days Discharge Instructions: May shower and wash wound with dial antibacterial soap and water prior to dressing change. Topical: bacitracin 1 x Per Day/30 Days Discharge Instructions: apply directly to wound. Its is over the counter. Prim Dressing: Xeroform Occlusive Gauze Dressing, 4x4 in 1 x Per Day/30 Days ary Discharge Instructions: Apply to wound bed as instructed Secondary Dressing: Zetuvit Plus Silicone Border Dressing 4x4 (in/in) 1 x Per Day/30 Days Discharge Instructions: Apply silicone border or bandaid over primary dressing  as directed. 1. In office sharp debridement 2. Bacitracin 3. Xeroform 4. Follow-up in 1 week 5. Work note given Psychologist, prison and probation services) Signed: 03/01/2023 1:57:44 PM By: Pearletha Alfred Signed: 03/01/2023 2:57:08 PM By: Geralyn Corwin DO Previous Signature: 01/08/2023 12:03:31 PM Version By: Geralyn Corwin DO Entered By: Pearletha Alfred on 03/01/2023 13:57:44 [place], [name] ([id number]) [id number] Page 6 of 7 -------------------------------------------------------------------------------- HxROS Details Patient Name: Date of Service: Kirsten Kramer [facility] 01/08/2023 7:45 A M Medical Record Number: [id number] Patient Account Number: [id number] Date of Birth/Sex: Treating RN: 1981-03-03 (42 y.o. Debara Pickett, Yvonne Kendall Primary Care Provider: Jackie Plum Other Clinician: Referring Provider: Treating  Provider/Extender: Francee Gentile in Treatment: 0 Integumentary (Skin) Complaints and Symptoms: Positive for: Wounds - left foot burn Constitutional Symptoms (General Health) Medical History: Past Medical History Notes: vitamin D deficiency Hematologic/Lymphatic Medical History: Positive for: Anemia - due to blood loss Immunizations Pneumococcal Vaccine: Received Pneumococcal Vaccination: No Implantable Devices No devices added Hospitalization / Surgery History Type of Hospitalization/Surgery c-section 2013 Family and Social History Cancer: Yes - Father; Diabetes: Yes - Father; Never smoker; Marital Status - Married; Alcohol Use: Never; Drug Use: No History; Caffeine Use: Never; Financial Concerns: No; Food, Clothing or Shelter Needs: No; Support System Lacking: No; Transportation Concerns: No Electronic Signature(s) Signed: 01/08/2023 12:03:31 PM By: Geralyn Corwin DO Signed: 01/08/2023 12:57:43 PM By: Karie Schwalbe RN Signed: 01/08/2023 4:16:18 PM By: Shawn Stall RN, BSN Entered By: Karie Schwalbe on 01/08/2023 07:58:59 -------------------------------------------------------------------------------- SuperBill Details Patient Name: Date of Service: Kirsten Kramer [facility] 01/08/2023 Medical Record Number: [id number] Patient Account Number: [id number] Date of Birth/Sex: Treating RN: September 17, 1980 (42 y.o. Debara Pickett, Yvonne Kendall Primary Care Provider: Jackie Plum Other Clinician: Referring Provider: Treating Provider/Extender: Francee Gentile in Treatment: 0 Diagnosis Coding [place], [name] ([id number]) [id number] Page 7 of 7 ICD-10 Codes Code Description T25.222A Burn of second degree of left foot, initial encounter Facility Procedures : CPT4 Code: 62952841 Description: 99213 - WOUND CARE VISIT-LEV 3 EST PT Modifier: Quantity: 1 : CPT4 Code: 32440102 Description: 16020 - BURN DRSG W/O  ANESTH-SM ICD-10 Diagnosis Description T25.222A Burn of second degree of left foot, initial encounter Modifier: Quantity: 1 Physician Procedures : CPT4 Code Description Modifier [id number] 40347 - WC PHYS LEVEL 4 - NEW PT ICD-10 Diagnosis Description T25.222A Burn of second degree of left foot, initial encounter Quantity: 1 : [id number] 16020 - WC PHYS DRESS/DEBRID SM,<5% TOT BODY SURF ICD-10 Diagnosis Description T25.222A Burn of second degree of left foot, initial encounter Quantity: 1 Electronic Signature(s) Signed: 01/08/2023 12:03:31 PM By: Geralyn Corwin DO Entered By: Geralyn Corwin on 01/08/2023 09:46:08

## 2023-01-15 ENCOUNTER — Encounter (HOSPITAL_BASED_OUTPATIENT_CLINIC_OR_DEPARTMENT_OTHER): Payer: Worker's Compensation | Attending: Internal Medicine | Admitting: Internal Medicine

## 2023-01-15 DIAGNOSIS — W208XXA Other cause of strike by thrown, projected or falling object, initial encounter: Secondary | ICD-10-CM | POA: Diagnosis not present

## 2023-01-15 DIAGNOSIS — T25222A Burn of second degree of left foot, initial encounter: Secondary | ICD-10-CM | POA: Diagnosis not present

## 2023-01-15 DIAGNOSIS — Y92219 Unspecified school as the place of occurrence of the external cause: Secondary | ICD-10-CM | POA: Insufficient documentation

## 2023-01-19 ENCOUNTER — Ambulatory Visit: Payer: BC Managed Care – PPO

## 2023-01-29 ENCOUNTER — Encounter (HOSPITAL_BASED_OUTPATIENT_CLINIC_OR_DEPARTMENT_OTHER): Payer: Worker's Compensation | Admitting: Internal Medicine

## 2023-01-29 DIAGNOSIS — T25222A Burn of second degree of left foot, initial encounter: Secondary | ICD-10-CM | POA: Diagnosis not present

## 2023-03-01 NOTE — Progress Notes (Signed)
Kirsten Kramer ([id number]) [id number] Page 1 of 6 Visit Report for 01/15/2023 Chief Complaint Document Details Patient Name: Date of Service: Kirsten Kramer [facility] 01/15/2023 8:45 A M Medical Record Number: [id number] Patient Account Number: [id number] Date of Birth/Sex: Treating RN: July 27, 1981 (42 y.o. F) Primary Care Provider: Jackie Plum Other Clinician: Referring Provider: Treating Provider/Extender: Francee Gentile in Treatment: 1 Information Obtained from: Patient Chief Complaint 01/08/2023; left foot wound Electronic Signature(s) Signed: 01/15/2023 9:32:46 AM By: Geralyn Corwin DO Entered By: Geralyn Corwin on 01/15/2023 09:29:26 -------------------------------------------------------------------------------- HPI Details Patient Name: Date of Service: Kirsten Kramer, Kirsten Kramer [facility] 01/15/2023 8:45 A M Medical Record Number: [id number] Patient Account Number: [id number] Date of Birth/Sex: Treating RN: 25-Apr-1981 (42 y.o. F) Primary Care Provider: Jackie Plum Other Clinician: Referring Provider: Treating Provider/Extender: Francee Gentile in Treatment: 1 History of Present Illness HPI Description: 01/08/2023 [name] is a 42 year old healthy female with no significant past medical history that presents to the clinic for a 3-week history of non healing burn to her left foot. 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 She has been following with her primary care physician for this issue and was started on Bactrim and has been using triple antibiotic ointment to the wound beds. She denies signs of infection. 5/17; patient presents for follow-up. She has been using Xeroform and antibiotic ointment to the wound bed. She has no issues or complaints today. Electronic Signature(s) Signed: 01/15/2023 9:32:46 AM By: Geralyn Corwin DO Entered By:  Geralyn Corwin on 01/15/2023 09:29:59 -------------------------------------------------------------------------------- Physical Exam Details Patient Name: Date of Service: Kirsten Kramer [facility] 01/15/2023 8:45 A M Medical Record Number: [id number] Patient Account Number: [id number] Date of Birth/Sex: Treating RN: 1981-01-25 (42 y.o. F) Primary Care Provider: Jackie Plum Other Clinician: Baruch Gouty ([id number]) [id number] Page 2 of 6 Referring Provider: Treating Provider/Extender: Francee Gentile in Treatment: 1 Constitutional respirations regular, non-labored and within target range for patient.. Cardiovascular 2+ dorsalis pedis/posterior tibialis pulses. Psychiatric pleasant and cooperative. Notes Left foot: T the dorsal aspect there is an open wound with granulation tissue. No signs of infection. A small area proximal to this has epithelialized. No o surrounding signs of infection. Electronic Signature(s) Signed: 01/15/2023 9:32:46 AM By: Geralyn Corwin DO Entered By: Geralyn Corwin on 01/15/2023 09:30:45 -------------------------------------------------------------------------------- Physician Orders Details Patient Name: Date of Service: Kirsten Kramer [facility] 01/15/2023 8:45 A M Medical Record Number: [id number] Patient Account Number: [id number] Date of Birth/Sex: Treating RN: [birth year] (42 y.o. Arta Silence Primary Care Provider: Jackie Plum Other Clinician: Referring Provider: Treating Provider/Extender: Francee Gentile in Treatment: 1 Verbal / Phone Orders: No Diagnosis Coding ICD-10 Coding Code Description T25.222A Burn of second degree of left foot, initial encounter Follow-up Appointments ppointment in 2 weeks. - Dr. Mikey Bussing 01/29/2023 0845 Return A Other: - bacitracin purchase over the counter. will be placed out of work for total of 3 weeks due to no pressure  with wearing shoes that would cause pressure to wound- out of work till 01/29/2023 for reevaluation. over the counter silicone cream for scarring called Mederma or BioCorneum. Anesthetic (In clinic) Topical Lidocaine 4% applied to wound bed Bathing/ Shower/ Hygiene May shower and wash wound with soap and water. Edema Control - Lymphedema / SCD / Other Elevate legs to the level of the heart or above for 30 minutes daily and/or when  sitting for 3-4 times a day throughout the day. Off-Loading Other: - wear shoes that will not apply pressure to top of foot- wound location. Wound Treatment Wound #1 - Foot Wound Laterality: Dorsal, Left Cleanser: Soap and Water 1 x Per Day/30 Days Discharge Instructions: May shower and wash wound with dial antibacterial soap and water prior to dressing change. Topical: bacitracin 1 x Per Day/30 Days Discharge Instructions: apply directly to wound. Its is over the counter. Prim Dressing: Xeroform Occlusive Gauze Dressing, 4x4 in 1 x Per Day/30 Days ary Discharge Instructions: Apply to wound bed as instructed Secondary Dressing: Zetuvit Plus Silicone Border Dressing 4x4 (in/in) 1 x Per Day/30 Days [place], Ronny Kramer ([id number]) [id number] Page 3 of 6 Discharge Instructions: Apply silicone border or bandaid over primary dressing as directed. Electronic Signature(s) Signed: 01/15/2023 9:32:46 AM By: Geralyn Corwin DO Entered By: Geralyn Corwin on 01/15/2023 09:30:51 -------------------------------------------------------------------------------- Problem List Details Patient Name: Date of Service: Kirsten Kramer, Kirsten Kramer [facility] 01/15/2023 8:45 A M Medical Record Number: [id number] Patient Account Number: [id number] Date of Birth/Sex: Treating RN: 1981/01/06 (42 y.o. Debara Pickett, Yvonne Kendall Primary Care Provider: Jackie Plum Other Clinician: Referring Provider: Treating Provider/Extender: Francee Gentile in Treatment:  1 Active Problems ICD-10 Encounter Code Description Active Date MDM Diagnosis T25.222A Burn of second degree of left foot, initial encounter 01/08/2023 No Yes Inactive Problems Resolved Problems Electronic Signature(s) Signed: 01/15/2023 9:32:46 AM By: Geralyn Corwin DO Entered By: Geralyn Corwin on 01/15/2023 09:29:18 -------------------------------------------------------------------------------- Progress Note Details Patient Name: Date of Service: Kirsten Kramer, Kirsten Kramer [facility] 01/15/2023 8:45 A M Medical Record Number: [id number] Patient Account Number: [id number] Date of Birth/Sex: Treating RN: 1981/01/06 (42 y.o. F) Primary Care Provider: Jackie Plum Other Clinician: Referring Provider: Treating Provider/Extender: Francee Gentile in Treatment: 1 Subjective Chief Complaint Information obtained from Patient 01/08/2023; left foot wound History of Present Illness (HPI) 01/08/2023 Ms. Marlenne Poleski is a 42 year old healthy female with no significant past medical history that presents to the clinic for a 3-week history of non healing burn to her left foot. 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 She has been following with her primary care physician for this issue and was started on Bactrim and has been using triple antibiotic ointment to the wound beds. She denies signs of infection. 5/17; patient presents for follow-up. She has been using Xeroform and antibiotic ointment to the wound bed. She has no issues or complaints today. [place], Ronny Kramer ([id number]) [id number] Page 4 of 6 Patient History Family History Cancer - Father, Diabetes - Father. Social History Never smoker, Marital Status - Married, Alcohol Use - Never, Drug Use - No History, Caffeine Use - Never. 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 Medical A Surgical History Notes nd Constitutional Symptoms (General Health) vitamin D deficiency Objective Constitutional respirations regular, non-labored and within target range for patient.. Vitals Time Taken: 9:02 AM, Height: 65 in, Weight: 180 lbs, BMI: 30, Temperature: 98.2 F, Pulse: 73 bpm, Respiratory Rate: 18 breaths/min, Blood Pressure: 119/84 mmHg. Cardiovascular 2+ dorsalis pedis/posterior tibialis pulses. Psychiatric pleasant and cooperative. General Notes: Left foot: T the dorsal aspect there is an open wound with granulation tissue. No signs of infection. A small area proximal to this has o epithelialized. No surrounding signs of infection. Integumentary (Hair, Skin) Wound #1 status is Open. Original cause of wound  was Thermal Burn. The date acquired was: 12/18/2022. The wound has been in treatment 1 weeks. The wound is located on the Left,Dorsal Foot. The wound measures 2.1cm length x 0.7cm width x 0.1cm depth; 1.155cm^2 area and 0.115cm^3 volume. There is Fat Layer (Subcutaneous Tissue) exposed. There is no tunneling or undermining noted. There is a medium amount of serosanguineous drainage noted. The wound margin is distinct with the outline attached to the wound base. There is large (67-100%) red, pink granulation within the wound bed. There is a small (1-33%) amount of necrotic tissue within the wound bed including Adherent Slough. The periwound skin appearance exhibited: Scarring. The periwound skin appearance did not exhibit: Callus, Crepitus, Excoriation, Induration, Rash, Dry/Scaly, Maceration, Atrophie Blanche, Cyanosis, Ecchymosis, Hemosiderin Staining, Mottled, Pallor, Rubor, Erythema. Assessment Active Problems ICD-10 Burn of second degree of left foot, initial encounter Patient's wound is well-healing. I recommended continue antibiotic ointment and Xeroform to the open wound. She can use a silicone-based cream to the closed  epithelialized areas to help with scarring. Also recommended sunscreen to the healed areas if she goes outside for long periods of time to also help with scarring. I will see her back in 2 weeks and I am hopeful the remaining wound will be healed. We will give her a work note to stay out until then. Plan Follow-up Appointments: Return Appointment in 2 weeks. - Dr. Mikey Bussing 01/29/2023 0845 Other: - bacitracin purchase over the counter. will be placed out of work for total of 3 weeks due to no pressure with wearing shoes that would cause pressure to wound- out of work till 01/29/2023 for reevaluation. over the counter silicone cream for scarring called Mederma or BioCorneum. Anesthetic: (In clinic) Topical Lidocaine 4% applied to wound bed Bathing/ Shower/ Hygiene: May shower and wash wound with soap and water. Edema Control - Lymphedema / SCD / Other: Elevate legs to the level of the heart or above for 30 minutes daily and/or when sitting for 3-4 times a day throughout the day. Off-LoadingVERCIE, ROADMAN ([id number]) [id number] Page 5 of 6 Other: - wear shoes that will not apply pressure to top of foot- wound location. WOUND #1: - Foot Wound Laterality: Dorsal, Left Cleanser: Soap and Water 1 x Per Day/30 Days Discharge Instructions: May shower and wash wound with dial antibacterial soap and water prior to dressing change. Topical: bacitracin 1 x Per Day/30 Days Discharge Instructions: apply directly to wound. Its is over the counter. Prim Dressing: Xeroform Occlusive Gauze Dressing, 4x4 in 1 x Per Day/30 Days ary Discharge Instructions: Apply to wound bed as instructed Secondary Dressing: Zetuvit Plus Silicone Border Dressing 4x4 (in/in) 1 x Per Day/30 Days Discharge Instructions: Apply silicone border or bandaid over primary dressing as directed. 1. Antibiotic ointment with Xeroform 2. Follow-up in 2 weeks 3. 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 F) Primary Care Provider: Jackie Plum Other Clinician: Referring Provider: Treating Provider/Extender: Francee Gentile in Treatment: 1 Constitutional Symptoms (General Health) Medical History: Past Medical History Notes: vitamin D deficiency Hematologic/Lymphatic Medical History: Positive for: Anemia - due to blood loss Immunizations Pneumococcal  Vaccine: Received Pneumococcal Vaccination: No Implantable Devices No devices added Hospitalization / Surgery History Type of Hospitalization/Surgery c-section 2013 Family and Social History Cancer: Yes - Father; Diabetes: Yes - Father; Never smoker; Marital Status - Married; Alcohol Use: Never; Drug Use: No History; Caffeine Use: Never; Financial Concerns: No; Food, Clothing or Shelter Needs: No; Support System Lacking: No; Transportation Concerns: No Electronic Signature(s) Signed: 01/15/2023 9:32:46 AM By: Geralyn Corwin DO Entered By: Geralyn Corwin on 01/15/2023 09:30:03 [place]vel, Ronny Kramer ([id number]) [id number] Page 6 of 6 -------------------------------------------------------------------------------- SuperBill Details Patient Name: Date of Service: Kirsten Kramer [facility] 01/15/2023 Medical Record Number: [id number] Patient Account Number: [id number] Date of Birth/Sex: Treating RN: 12/07/1980 (42 y.o. Arta Silence Primary Care Provider: Jackie Plum  Other Clinician: Referring Provider: Treating Provider/Extender: Francee Gentile in Treatment: 1 Diagnosis Coding ICD-10 Codes Code Description T25.222A Burn of second degree of left foot, initial encounter Facility Procedures : CPT4 Code: [id number] Description: 99213 - WOUND CARE VISIT-LEV 3 EST PT Modifier: Quantity: 1 Physician Procedures : CPT4 Code Description Modifier [id number] 99213 - WC PHYS LEVEL 3 - EST PT ICD-10 Diagnosis Description T25.222A Burn of second degree of left foot, initial encounter Quantity: 1 Electronic Signature(s) Signed: 01/15/2023 9:32:46 AM By: Geralyn Corwin DO Entered By: Geralyn Corwin on 01/15/2023 09:32:23

## 2023-03-01 NOTE — Progress Notes (Signed)
[place], Kirsten Kramer ([id number]) [id number] Page 1 of 5 Visit Report for 01/29/2023 Chief Complaint Document Details Patient Name: Date of Service: Kirsten Kramer [facility] 01/29/2023 8:45 A M Medical Record Number: [id number] Patient Account Number: [id number] Date of Birth/Sex: Treating RN: 07/20/1981 (42 y.o. F) Primary Care Provider: Jackie Plum Other Clinician: Referring Provider: Treating Provider/Extender: Francee Gentile in Treatment: 3 Information Obtained from: Patient Chief Complaint 01/08/2023; left foot wound Electronic Signature(s) Signed: 01/29/2023 12:18:22 PM By: Geralyn Corwin DO Entered By: Geralyn Corwin on 01/29/2023 09:14:47 -------------------------------------------------------------------------------- HPI Details Patient Name: Date of Service: Kirsten Kramer, Kirsten Kramer 01/29/2023 8:45 A M Medical Record Number: [id number] Patient Account Number: [id number] Date of Birth/Sex: Treating RN: Oct 17, 1980 (42 y.o. F) Primary Care Provider: Jackie Plum Other Clinician: Referring Provider: Treating Provider/Extender: Francee Gentile in Treatment: 3 History of Present Illness HPI Description: 01/08/2023 Kirsten Kramer is a 42 year old healthy female with no significant past medical history that presents to the clinic for a 3-week history of non healing burn to her left foot. 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 She has been following with her primary care physician for this issue and was started on Bactrim and has been using triple antibiotic ointment to the wound beds. She denies signs of infection. 5/17; patient presents for follow-up. She has been using Xeroform and antibiotic ointment to the wound bed. She has no issues or complaints today. 5/31; patient presents for follow-up. She has been using antibiotic ointment with Xeroform  to the wound bed. Her wound is healed. She may return to work without restrictions. Electronic Signature(s) Signed: 01/29/2023 12:18:22 PM By: Geralyn Corwin DO Entered By: Geralyn Corwin on 01/29/2023 09:15:27 -------------------------------------------------------------------------------- Physical Exam Details Patient Name: Date of Service: Kirsten Kramer [facility] 01/29/2023 8:45 A M Medical Record Number: [id number] Patient Account Number: [id number] Kirsten Kramer, Kirsten Kramer ([id number]) [id number] Page 2 of 5 Date of Birth/Sex: Treating RN: 07/31/1981 (42 y.o. F) Primary Care Provider: Other Clinician: Jackie Plum Referring Provider: Treating Provider/Extender: Francee Gentile in Treatment: 3 Constitutional respirations regular, non-labored and within target range for patient.. Cardiovascular 2+ dorsalis pedis/posterior tibialis pulses. Psychiatric pleasant and cooperative. Notes Left foot: T the dorsal aspect there is epithelialization to the previous wound site. No signs of infection. No surrounding signs of infection. o Electronic Signature(s) Signed: 01/29/2023 12:18:22 PM By: Geralyn Corwin DO Entered By: Geralyn Corwin on 01/29/2023 09:38:58 -------------------------------------------------------------------------------- Physician Orders Details Patient Name: Date of Service: Kirsten Kramer [facility] 01/29/2023 8:45 A M Medical Record Number: [id number] Patient Account Number: [id number] Date of Birth/Sex: Treating RN: October 27, 1980 (42 y.o. Arta Silence Primary Care Provider: Jackie Plum Other Clinician: Referring Provider: Treating Provider/Extender: Francee Gentile in Treatment: 3 Verbal / Phone Orders: No Diagnosis Coding Follow-up Appointments Other: - patient may return to work with no restrictions. Discharge From [facility] Discharge from Wound Care Center - apply for  scarring called Mederma or BioCorneum. Wear sunscreen and cover closed areas for 6 months for protection from sun. Electronic Signature(s) Signed: 01/29/2023 12:18:22 PM By: Geralyn Corwin DO Entered By: Geralyn Corwin on 01/29/2023 09:39:03 -------------------------------------------------------------------------------- Problem List Details Patient Name: Date of Service: Kirsten Kramer [facility] 01/29/2023 8:45 A M Medical Record Number: [id number] Patient Account Number: [id number] Date of Birth/Sex: Treating RN: April 14, 1981 (42 y.o. Arta Silence Primary Care Provider: Jackie Plum Other  Clinician: Referring Provider: Treating Provider/Extender: Francee Gentile in Treatment: 3 Active Problems ICD-10 [place], [place] ([id number]) [id number] Page 3 of 5 Encounter Code Description Active Date MDM Diagnosis T25.222A Burn of second degree of left foot, initial encounter 01/08/2023 No Yes Inactive Problems Resolved Problems Electronic Signature(s) Signed: 01/29/2023 12:18:22 PM By: Geralyn Corwin DO Entered By: Geralyn Corwin on 01/29/2023 09:14:37 -------------------------------------------------------------------------------- Progress Note Details Patient Name: Date of Service: Kirsten Kramer, Kirsten Kramer 01/29/2023 8:45 A M Medical Record Number: [id number] Patient Account Number: [id number] Date of Birth/Sex: Treating RN: 12/03/1980 (42 y.o. F) Primary Care Provider: Jackie Plum Other Clinician: Referring Provider: Treating Provider/Extender: Francee Gentile in Treatment: 3 Subjective Chief Complaint Information obtained from Patient 01/08/2023; left foot wound History of Present Illness (HPI) 01/08/2023 Kirsten Kramer is a 42 year old healthy female with no significant past medical history that presents to the clinic for a 3-week history of non healing burn to her left foot.  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 She has been following with her primary care physician for this issue and was started on Bactrim and has been using triple antibiotic ointment to the wound beds. She denies signs of infection. 5/17; patient presents for follow-up. She has been using Xeroform and antibiotic ointment to the wound bed. She has no issues or complaints today. 5/31; patient presents for follow-up. She has been using antibiotic ointment with Xeroform to the wound bed. Her wound is healed. She may return to work without restrictions. Patient History Family History Cancer - Father, Diabetes - Father. Social History Never smoker, Marital Status - Married, Alcohol Use - Never, Drug Use - No History, Caffeine Use - Never. 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 Medical A Surgical History Notes nd Constitutional Symptoms (General Health) vitamin D deficiency Objective Constitutional respirations regular, non-labored and within target range for patient.Marland Kitchen Kirsten Kramer, Kirsten Kramer ([id number]) [id number] Page 4 of 5 Vitals Time Taken: 8:53 AM, Height: 65 in, Weight: 180 lbs, BMI: 30, Temperature: 98.1 F, Pulse: 76 bpm, Respiratory Rate: 18 breaths/min, Blood Pressure: 111/77 mmHg. Cardiovascular 2+ dorsalis pedis/posterior tibialis pulses. Psychiatric pleasant and cooperative. General Notes: Left foot: T the dorsal aspect there is epithelialization to the previous wound site. No signs of infection. No surrounding signs of infection. o Integumentary (Hair, Skin) Wound #1 status is Healed - Epithelialized. Original cause of wound was Thermal Burn. The date acquired was: 12/18/2022. The wound has been in treatment 3 weeks. The wound is located on the Left,Dorsal Foot. The wound measures 0cm length x 0cm width x  0cm depth; 0cm^2 area and 0cm^3 volume. There is no tunneling or undermining noted. There is a none present amount of drainage noted. The wound margin is distinct with the outline attached to the wound base. There is no granulation within the wound bed. There is no necrotic tissue within the wound bed. The periwound skin appearance had no abnormalities noted for texture. The periwound skin appearance had no abnormalities noted for moisture. The periwound skin appearance had no abnormalities noted for color. Periwound temperature was noted as No Abnormality. Assessment Active Problems ICD-10 Burn of second degree of left foot, initial encounter Patient's wound has done well with Xeroform and bacitracin. Her wounds have healed. I recommended using scar cream daily with SPF. She knows to call with any questions  or concerns. She may return to work without restrictions. Plan Follow-up Appointments: Other: - patient may return to work with no restrictions. Discharge From [facility] Services: Discharge from Wound Care Center - apply for scarring called Mederma or BioCorneum. Wear sunscreen and cover closed areas for 6 months for protection from sun. 1. Discharge from clinic due to closed wound 2. Follow-up as needed 3. Scar cream with SPF Electronic Signature(s) Signed: 03/01/2023 1:58:29 PM By: Pearletha Alfred Signed: 03/01/2023 2:58:02 PM By: Geralyn Corwin DO Previous Signature: 01/29/2023 12:18:22 PM Version By: Geralyn Corwin DO Entered By: Pearletha Alfred on 03/01/2023 13:58:29 -------------------------------------------------------------------------------- HxROS Details Patient Name: Date of Service: Kirsten Kramer [facility] 01/29/2023 8:45 A M Medical Record Number: [id number] Patient Account Number: [id number] Date of Birth/Sex: Treating RN: 04/10/1981 (42 y.o. F) Primary Care Provider: Jackie Plum Other Clinician: Referring Provider: Treating Provider/Extender: Francee Gentile in Treatment: 3 Constitutional Symptoms (General Health) Medical History: Past Medical History Notes: vitamin D deficiency Kirsten Kramer, Kirsten Kramer ([id number]) [id number] Page 5 of 5 Hematologic/Lymphatic Medical History: Positive for: Anemia - due to blood loss Immunizations Pneumococcal Vaccine: Received Pneumococcal Vaccination: No Implantable Devices No devices added Hospitalization / Surgery History Type of Hospitalization/Surgery c-section 2013 Family and Social History Cancer: Yes - Father; Diabetes: Yes - Father; Never smoker; Marital Status - Married; Alcohol Use: Never; Drug Use: No History; Caffeine Use: Never; Financial Concerns: No; Food, Clothing or Shelter Needs: No; Support System Lacking: No; Transportation Concerns: No Electronic Signature(s) Signed: 01/29/2023 12:18:22 PM By: Geralyn Corwin DO Entered By: Geralyn Corwin on 01/29/2023 09:15:46 -------------------------------------------------------------------------------- SuperBill Details Patient Name: Date of Service: Kirsten Kramer, Kirsten Macho [facility] 01/29/2023 Medical Record Number: [id number] Patient Account Number: [id number] Date of Birth/Sex: Treating RN: 1980/10/07 (42 y.o. Arta Silence Primary Care Provider: Jackie Plum Other Clinician: Referring Provider: Treating Provider/Extender: Francee Gentile in Treatment: 3 Diagnosis Coding ICD-10 Codes Code Description T25.222A Burn of second degree of left foot, initial encounter Facility Procedures : CPT4 Code: [id number] Description: [id number] - WOUND CARE VISIT-LEV 2 EST PT Modifier: Quantity: 1 Physician Procedures : CPT4 Code Description Modifier [id number] - WC PHYS LEVEL 3 - EST PT ICD-10 Diagnosis Description T25.222A Burn of second degree of left foot, initial encounter Quantity: 1 Electronic Signature(s) Signed: 01/29/2023 12:18:22 PM By: Geralyn Corwin DO Entered By: Geralyn Corwin on 01/29/2023 09:41:01

## 2023-11-02 ENCOUNTER — Other Ambulatory Visit: Payer: Self-pay | Admitting: Physician Assistant

## 2023-11-02 DIAGNOSIS — Z1231 Encounter for screening mammogram for malignant neoplasm of breast: Secondary | ICD-10-CM
